# Patient Record
Sex: Female | Born: 1975 | Race: Black or African American | Hispanic: No | Marital: Single | Smoking: Never smoker
Health system: Southern US, Community
[De-identification: ages and names within clinical notes are randomized; demographics above are authoritative.]

## PROBLEM LIST (undated history)

## (undated) DIAGNOSIS — M549 Dorsalgia, unspecified: Secondary | ICD-10-CM

## (undated) DIAGNOSIS — G2581 Restless legs syndrome: Secondary | ICD-10-CM

## (undated) DIAGNOSIS — R42 Dizziness and giddiness: Secondary | ICD-10-CM

## (undated) DIAGNOSIS — F419 Anxiety disorder, unspecified: Secondary | ICD-10-CM

## (undated) HISTORY — DX: Dizziness and giddiness: R42

## (undated) HISTORY — DX: Restless legs syndrome: G25.81

## (undated) HISTORY — DX: Anxiety disorder, unspecified: F41.9

## (undated) HISTORY — PX: APPENDECTOMY: SHX54

## (undated) HISTORY — PX: CARPAL TUNNEL RELEASE: SHX101

---

## 2000-09-27 ENCOUNTER — Emergency Department (HOSPITAL_COMMUNITY): Admission: EM | Admit: 2000-09-27 | Discharge: 2000-09-27 | Payer: Self-pay

## 2001-05-04 ENCOUNTER — Emergency Department (HOSPITAL_COMMUNITY): Admission: EM | Admit: 2001-05-04 | Discharge: 2001-05-04 | Payer: Self-pay | Admitting: Emergency Medicine

## 2001-05-04 ENCOUNTER — Encounter: Payer: Self-pay | Admitting: Emergency Medicine

## 2002-09-07 ENCOUNTER — Emergency Department (HOSPITAL_COMMUNITY): Admission: EM | Admit: 2002-09-07 | Discharge: 2002-09-07 | Payer: Self-pay | Admitting: *Deleted

## 2003-11-24 ENCOUNTER — Emergency Department (HOSPITAL_COMMUNITY): Admission: EM | Admit: 2003-11-24 | Discharge: 2003-11-24 | Payer: Self-pay | Admitting: Emergency Medicine

## 2004-06-28 ENCOUNTER — Emergency Department (HOSPITAL_COMMUNITY): Admission: EM | Admit: 2004-06-28 | Discharge: 2004-06-28 | Payer: Self-pay | Admitting: Emergency Medicine

## 2006-07-17 ENCOUNTER — Emergency Department (HOSPITAL_COMMUNITY): Admission: EM | Admit: 2006-07-17 | Discharge: 2006-07-17 | Payer: Self-pay | Admitting: Emergency Medicine

## 2006-10-01 ENCOUNTER — Emergency Department (HOSPITAL_COMMUNITY): Admission: EM | Admit: 2006-10-01 | Discharge: 2006-10-01 | Payer: Self-pay | Admitting: Emergency Medicine

## 2007-11-28 ENCOUNTER — Emergency Department (HOSPITAL_COMMUNITY): Admission: EM | Admit: 2007-11-28 | Discharge: 2007-11-28 | Payer: Self-pay | Admitting: Emergency Medicine

## 2008-08-19 ENCOUNTER — Emergency Department (HOSPITAL_COMMUNITY): Admission: EM | Admit: 2008-08-19 | Discharge: 2008-08-19 | Payer: Self-pay | Admitting: *Deleted

## 2015-02-05 ENCOUNTER — Emergency Department (HOSPITAL_COMMUNITY)
Admission: EM | Admit: 2015-02-05 | Discharge: 2015-02-05 | Disposition: A | Discharge status: Home / Self Care | Payer: No Typology Code available for payment source | Attending: Emergency Medicine | Admitting: Emergency Medicine

## 2015-02-05 ENCOUNTER — Emergency Department (EMERGENCY_DEPARTMENT_HOSPITAL)
Admit: 2015-02-05 | Discharge: 2015-02-05 | Disposition: A | Discharge status: Home / Self Care | Payer: No Typology Code available for payment source | Attending: Emergency Medicine | Admitting: Emergency Medicine

## 2015-02-05 ENCOUNTER — Encounter (HOSPITAL_COMMUNITY): Payer: Self-pay | Admitting: Emergency Medicine

## 2015-02-05 DIAGNOSIS — Z3202 Encounter for pregnancy test, result negative: Secondary | ICD-10-CM | POA: Insufficient documentation

## 2015-02-05 DIAGNOSIS — M79609 Pain in unspecified limb: Secondary | ICD-10-CM

## 2015-02-05 DIAGNOSIS — M5416 Radiculopathy, lumbar region: Secondary | ICD-10-CM | POA: Diagnosis not present

## 2015-02-05 DIAGNOSIS — M545 Low back pain: Secondary | ICD-10-CM | POA: Diagnosis present

## 2015-02-05 HISTORY — DX: Dorsalgia, unspecified: M54.9

## 2015-02-05 LAB — I-STAT CHEM 8, ED
BUN: 14 mg/dL (ref 6–20)
Calcium, Ion: 1.16 mmol/L (ref 1.12–1.23)
Chloride: 102 mmol/L (ref 101–111)
Creatinine, Ser: 0.9 mg/dL (ref 0.44–1.00)
Glucose, Bld: 93 mg/dL (ref 65–99)
HCT: 42 % (ref 36.0–46.0)
Hemoglobin: 14.3 g/dL (ref 12.0–15.0)
Potassium: 4.4 mmol/L (ref 3.5–5.1)
Sodium: 138 mmol/L (ref 135–145)
TCO2: 24 mmol/L (ref 0–100)

## 2015-02-05 LAB — I-STAT BETA HCG BLOOD, ED (MC, WL, AP ONLY): I-stat hCG, quantitative: <5 m[IU]/mL (ref <5)

## 2015-02-05 MED ORDER — METHOCARBAMOL 500 MG PO TABS
1000.0000 mg | ORAL_TABLET | Freq: Once | ORAL | Status: AC
  Administered 2015-02-05: 1000 mg via ORAL
  Filled 2015-02-05: qty 2

## 2015-02-05 MED ORDER — METHOCARBAMOL 500 MG PO TABS: 1000.0000 mg | ORAL_TABLET | Freq: Four times a day (QID) | ORAL | Status: AC | PRN

## 2015-02-05 NOTE — Discharge Instructions (Signed)
For pain control you may take up to  of Motrin (also known as ibuprofen). That is usually 4 over the counter pills,  3 times a day. Take with food to minimize stomach irritation   You can also take  tylenol (acetaminophen)  (this is 3 over the counter pills) four times a day. Do not drink alcohol or combine with other medications that have acetaminophen as an ingredient (Read the labels!).    For breakthrough pain you may take Robaxin. Do not drink alcohol, drive or operate heavy machinery when taking Robaxin.  Do not hesitate to return to the emergency room for any new, worsening or concerning symptoms.  Please obtain primary care using resource guide below. Let them know that you were seen in the emergency room and that they will need to obtain records for further outpatient management.   Lumbosacral Radiculopathy Lumbosacral radiculopathy is a condition that involves the spinal nerves and nerve roots in the low back and bottom of the spine. The condition develops when these nerves and nerve roots move out of place or become inflamed and cause symptoms. CAUSES This condition may be caused by:  Pressure from a disk that bulges out of place (herniated disk). A disk is a plate of cartilage that separates bones in the spine.  Disk degeneration.  A narrowing of the bones of the lower back (spinal stenosis).  A tumor.  An infection.  An injury that places sudden pressure on the disks that cushion the bones of your lower spine. RISK FACTORS This condition is more likely to develop in:  Males aged 30-50 years.  Females aged 50-60 years.  People who lift improperly.  People who are overweight or live a sedentary lifestyle.  People who smoke.  People who perform repetitive activities that strain the spine. SYMPTOMS Symptoms of this condition include:  Pain that goes down from the back into the legs (sciatica). This is the most common symptom. The pain may be worse with  sitting, coughing, or sneezing.  Pain and numbness in the arms and legs.  Muscle weakness.  Tingling.  Loss of bladder control or bowel control. DIAGNOSIS This condition is diagnosed with a physical exam and medical history. If the pain is lasting, you may have tests, such as:  MRI scan.  X-ray.  CT scan.  Myelogram.  Nerve conduction study. TREATMENT This condition is often treated with:  Hot packs and ice applied to affected areas.  Stretches to improve flexibility.  Exercises to strengthen back muscles.  Physical therapy.  Pain medicine.  A steroid injection in the spine. In some cases, no treatment is needed. If the condition is long-lasting (chronic), or if symptoms are severe, treatment may involve surgery or lifestyle changes, such as following a weight loss plan. HOME CARE INSTRUCTIONS Medicines  Take medicines only as directed by your health care provider.  Do not drive or operate heavy machinery while taking pain medicine. Injury Care  Apply a heat pack to the injured area as directed by your health care provider.  Apply ice to the affected area:  Put ice in a plastic bag.  Place a towel between your skin and the bag.  Leave the ice on for 20-30 minutes, every 2 hours while you are awake or as needed. Or, leave the ice on for as long as directed by your health care provider. Other Instructions  If you were shown how to do any exercises or stretches, do them as directed by your health care  provider.  If your health care provider prescribed a diet or exercise program, follow it as directed.  Keep all follow-up visits as directed by your health care provider. This is important. SEEK MEDICAL CARE IF:  Your pain does not improve over time even when taking pain medicines. SEEK IMMEDIATE MEDICAL CARE IF:  Your develop severe pain.  Your pain suddenly gets worse.  You develop increasing weakness in your legs.  You lose the ability to control  your bladder or bowel.  You have difficulty walking or balancing.  You have a fever.   This information is not intended to replace advice given to you by your health care provider. Make sure you discuss any questions you have with your health care provider.   Document Released: 03/07/2005 Document Revised: 07/22/2014 Document Reviewed: 03/03/2014 Elsevier Interactive Patient Education 2016 [employer].   Emergency Department Resource Guide 1) Find a Doctor and Pay Out of Pocket Although you won't have to find out who is covered by your insurance plan, it is a good idea to ask around and get recommendations. You will then need to call the office and see if the doctor you have chosen will accept you as a new patient and what types of options they offer for patients who are self-pay. Some doctors offer discounts or will set up payment plans for their patients who do not have insurance, but you will need to ask so you aren't surprised when you get to your appointment.  2) Contact Your Local Health Department Not all health departments have doctors that can see patients for sick visits, but many do, so it is worth a call to see if yours does. If you don't know where your local health department is, you can check in your phone book. The CDC also has a tool to help you locate your state's health department, and many state websites also have listings of all of their local health departments.  3) Find a Walk-in Clinic If your illness is not likely to be very severe or complicated, you may want to try a walk in clinic. These are popping up all over the country in pharmacies, drugstores, and shopping centers. They're usually staffed by nurse practitioners or physician assistants that have been trained to treat common illnesses and complaints. They're usually fairly quick and inexpensive. However, if you have serious medical issues or chronic medical problems, these are probably not your best option.  No  Primary Care Doctor: - Call Health Connect at  [phone] - they can help you locate a primary care doctor that  accepts your insurance, provides certain services, etc. - Physician Referral Service- [phone]  Chronic Pain Problems: Organization         Address  Phone   Notes  [facility]  [phone] Patients need to be referred by their primary care doctor.   Medication Assistance: Organization         Address  Phone   Notes  [facility] Medication [facility] [address] [place] [place], [place] [address] [phone] --Must be a resident of [facility] -- Must have NO insurance coverage whatsoever (no Medicaid/ Medicare, etc.) -- The pt. MUST have a primary care doctor that directs their care regularly and follows them in the community   [facility]ist  [phone]   [employer]  [phone]    Agencies that provide inexpensive medical care: Organization         Address  Phone   Notes  [facility]  [phone]   [facility]    [phone]) [phone]   [facility] [address] [place], [place] [address] [phone]) [phone]   [facility] [address], [place] [phone]) [phone]   [facility]anned Parenthood    [phone](336) [phone]   [facility]    [phone](336) [phone]   [facility]  [address], [place] Phone:  [phone](336) [phone], Fax:  [phone] Hours of Operation:  9 am - 6 pm, M-F.  Also accepts Medicaid/Medicare and self-pay.  [facility]  [address], [place] Phone: [phone](336) [phone], Fax: [phone](336) [phone]. Hours of Operation:  8:30 am - 5:30 pm, M-F.  Also accepts Medicaid and self-pay.  [facility] [place] [address], [place] Phone: [phone]) [phone]   [facility] [address], [place], [place] [phone], Ext. [phone] Mondays & Thursdays: 7-9 AM.  First 15 patients are seen on  a first come, first serve basis.    Medicaid-accepting [facility] Providers:  Organization         Address  Phone   Notes  [facility] [address], [place] [phone]) [phone] Also accepts self-pay patients.  [facility] [address] [place], [place]  [phone]) [phone]   [facility] [address], [place] [phone]) [phone]   [facility] [address] [place] [address], [place] [phone](336) [phone]   Renaye RakersVeita Bland [address], [place]   [phone] Only accepts [place] Access [place] patients after they have their name applied to their card.   Self-Pay (no insurance) in [facility]:  Organization         Address  Phone   Notes  Sickle Cell Patients, [facility] [address], [place] [phone]) [phone]   [facility] [address], [place] [phone]) [phone]   [facility] [place]  [address], [place] [phone]) [phone]   [facility]/Dr. Osei-Bonsu  [address] [place] [address], [place] or [id number], [place] [phone]) [phone] Phone number for both [place] and [place] locations is the same.  [facility] [address], [place] [phone]) [phone]   [facility] [place] [address] [place] [address], [place] or [address] [phone]) [phone]) [phone]   [facility] [address], [place] [phone]) [phone], phone; [phone](336) [phone], fax Sees patients 1st and 3rd Saturday of every month.  Must not qualify for public or private insurance (i.e. Medicaid, Medicare, [place] Health Choice, Veterans' Benefits)  Household income should be no more than 200% of the poverty level The clinic cannot treat you if you are pregnant or think you are pregnant  Sexually transmitted diseases are not treated at the clinic.    Dental Care: Organization          Address  Phone  Notes  [facility] Department of [facility] [address], [place] [phone]) [phone] Accepts children up to age 37 who are enrolled in [place] or [place] Health Choice; pregnant women with a Medicaid card; and children who have applied for Medicaid or [place] Health Choice, but were declined, whose parents can pay a reduced fee at time of service.  [facility] Department of [facility]  [address], [place] [phone] Accepts children up to age 7 who are enrolled in [place] or [place] Health Choice; pregnant women with a Medicaid card; and children who have applied for Medicaid or [place] Health Choice, but were declined, whose parents can pay a reduced fee at time of service.  [facility]  [address] [place], [place] [phone] Patients are seen by appointment only. Walk-ins are not accepted. [facility] will see patients 50 years of age and older. Monday - Tuesday (8am-5pm) Most Wednesdays (8:30-5pm) $30 per visit, cash only  [facility] Adult [facility] Access PRO[facility]  [address], [facility] [place] [phone] Patients are seen by appointment only. Walk-ins are not accepted. [facility] will see patients 95 years of age and older. One Wednesday Evening (Monthly: Volunteer Based).  $30 per visit, cash only  [employer]  [phone] for adults; Children under age 93, call Graduate Pediatric Dentistry at [phone]. Children aged 82-14, please call [phone] to request a pediatric application.  Dental services are provided in all areas of dental care including fillings, crowns and bridges, complete and partial dentures, implants, gum treatment, root canals, and extractions. Preventive care is also provided. Treatment is provided to both adults and children. Patients are selected via a lottery and there is often a waiting list.   [facility] [address] [place]  [phone] [email or web address]   [facility] [address] [place], [place] [phone], Ext. [phone] Second and Fourth Thursday of each month, opens at 6:30 AM; Clinic ends at 9 AM.  Patients are seen on a first-come first-served basis, and a limited number are seen during each clinic.   [facility]  [address] [place], [place] [phone]   Eligibility Requirements You must have lived in [place], [place], or [place] counties for at least the last three months.   You cannot be eligible for state or federal sponsored [place], including [employer], [place], or [employer].   You generally cannot be eligible for healthcare insurance through your employer.    How to apply: Eligibility screenings are held every Tuesday and Wednesday afternoon from 1:00 pm until 4:00 pm. You do not need an appointment for the interview!  [facility] [address] [place], [place] [phone]   [facility] Health Department  [phone]   [facility] Health Department  [phone]   [facility] Health Department  [phone]    Behavioral Health Resources in the Community: Intensive Outpatient Programs Organization         Address  Phone  Notes  [facility] [address], [place], [place] [phone]   [facility] Outpatient [address], [place], [place] [phone]   [facility] [address], [place], [place]  [phone]   [facility] Mental Health [address]  [place], [place] [phone] or [phone]   Substance Abuse Resources Organization         Address  Phone  Notes  [facility] Drug Services  [phone]   [facility]  [phone]   [facility] [place]  [phone]   Floydene Flock  [phone]   Residential & Outpatient Substance Abuse Program  [phone]   Psychological  Services Organization         Address  Phone  Notes  [facility]  [phone]   [facility] Services  [phone]   [facility] Mental Health [address], [place] [phone] or [phone]    Mobile Crisis Teams Organization         Address  Phone  Notes  [facility], Mobile Crisis Care Unit  [phone]   [facility]  [address] [place], [place] [phone]   Doristine Locks [address] [place] [phone]    Self-Help/Support Groups Organization         Address  Phone             Notes  [facility]oc[facility] of [place] - variety of support groups  [phone]- [phone] Call for more information  Narcotics Anonymous (NA), [facility] [address], [employer] [place]  2 meetings at this location   Statistician         Address  Phone  Notes  [facility] [address],    [place]  [phone]   [facility]  [address] [place] [address], [place], [place] [phone]   [facility] [address] [place], [place] [phone] Admissions: 8am-3pm M-F  [facility] [address]    [place], [place] [phone]   [facility] [address] [place], [place], [place] [phone]   [facility] [address],  [place], [place] [phone]   [facility] - Intensive Outpatient [address], [place], [place] [phone]   [facility] ([facility] [address] [place]  [place], [place] [phone] or [phone]   [facility] ([facility]) [address] [place], [place] [phone] Accepts Medicaid  [facility] [address]  [place] [phone] Substance Abuse/Addiction Treatment   [facility] Organization         Address  Phone  Notes  [facility]  [phone]   Angie Fava, PhD [address] [place], [place]   [phone] or [phone]   [facility] Behavioral   [address] [place], [place] [phone]   [facility] [address], [place], [place] [phone] Insurance/Medicaid/sponsorship through [facility] and Families [address]                                    [place], [place] [phone] Therapy/tele-psych/case  [facility] [address], [place] [phone]    Dr. Lolly Mustache  [phone]   [facility]  [employer] [facility] Dept. 1) [address] [place] 2) [address] [place] 3)  [address], [place] [phone]  [phone]   [facility] Child Abuse Hotline 580-426-5219 or [phone] (After Hours)

## 2015-02-05 NOTE — ED Notes (Signed)
Per pt, states chronic back pain-started having right leg pain and tingling for a couple of days-states OTC meds not helping

## 2015-02-05 NOTE — ED Notes (Signed)
Pt ambulatory to wheelchair. Given orthopedic follow up and resource guide for PCP follow up. No other c/c.

## 2015-02-05 NOTE — Progress Notes (Signed)
VASCULAR LAB PRELIMINARY  PRELIMINARY  PRELIMINARY  PRELIMINARY  Right lower extremity venous duplex completed.    Preliminary report:  Right:  No evidence of DVT, superficial thrombosis, or Baker's cyst.  Arris Meyn, RVS 02/05/2015, 11:51 AM

## 2015-02-05 NOTE — ED Provider Notes (Signed)
CSN: [id number]     Arrival date & time 02/05/15  0919 History   First MD Initiated Contact with Patient 02/05/15 1006     Chief Complaint  Patient presents with  . back pain/leg pain      (Consider location/radiation/quality/duration/timing/severity/associated sxs/prior Treatment) HPI   Blood pressure 139/85, pulse 80, temperature 98.2 F (36.8 C), temperature source Oral, resp. rate 16, last menstrual period 01/22/2015, SpO2 99 %.  Tonya Campbell is a 39 y.o. female complaining of exacerbation of her chronic low back pain, this is atraumatic. States that the pain is in the low back and normally radiates down the posterior leg to the knee. States that over the course of the last week it has gone into the calf, calf is very tender to the touch. She's been taking Motrin 800 mg 3 times a day which normally alleviates the pain but has not been over the last several days. Pt denies fever, cough, h/o DVT/PE, calf pain or leg swelling, hemoptysis, recent immobilization, cancer/chemotherapy in the last 6 months, exogenous estrogen fever, chills, change in bowel or bladder habits, h/o IDVU or cancer, numbness or weakness.    Past Medical History  Diagnosis Date  . Back pain    Past Surgical History  Procedure Laterality Date  . Appendectomy     No family history on file. Social History  Substance Use Topics  . Smoking status: Never Smoker   . Smokeless tobacco: None  . Alcohol Use: No   OB History    No data available     Review of Systems  10 systems reviewed and found to be negative, except as noted in the HPI.   Allergies  Review of patient's allergies indicates not on file.  Home Medications   Prior to Admission medications   Not on File   BP 139/85 mmHg  Pulse 80  Temp(Src) 98.2 F (36.8 C) (Oral)  Resp 16  SpO2 99%  LMP 01/22/2015 Physical Exam  Constitutional: She appears well-developed and well-nourished.  HENT:  Head: Normocephalic.  Eyes: Conjunctivae  are normal.  Neck: Normal range of motion.  Cardiovascular: Normal rate, regular rhythm and intact distal pulses.   Pulmonary/Chest: Effort normal.  Abdominal: Soft. There is no tenderness.  Musculoskeletal: She exhibits tenderness. She exhibits no edema.  Patient is tender on the right calf there is no swelling, superficial collaterals are palpable cords.  Neurological: She is alert.  No point tenderness to percussion of lumbar spinal processes.  No TTP or paraspinal muscular spasm. Strength is 5 out of 5 to bilateral lower extremities at hip and knee; extensor hallucis longus 5 out of 5. Ankle strength 5 out of 5, no clonus, neurovascularly intact. No saddle anaesthesia. Patellar reflexes are 2+ bilaterally.    Right leg raise is negative bilaterally. Patient ambulates with a coordinated in nonantalgic gait.   Psychiatric: She has a normal mood and affect.  Nursing note and vitals reviewed.   ED Course  Procedures (including critical care time) Labs Review Labs Reviewed - No data to display  Imaging Review No results found. I have personally reviewed and evaluated these images and lab results as part of my medical decision-making.   EKG Interpretation None      MDM   Final diagnoses:  Lumbar radiculopathy, acute    Filed Vitals:   02/05/15 0935  BP: 139/85  Pulse: 80  Temp: 98.2 F (36.8 C)  TempSrc: Oral  Resp: 16  SpO2: 99%    Medications  methocarbamol (ROBAXIN) tablet 1,000 mg (1,000 mg Oral Given 02/05/15 1122)    Tonya Campbell is 39 y.o. female presenting with worsening of her chronic low back pain, it radiates down to the calf. Physical exam is not consistent with DVT/PE and patient has no risk factors but she is very tender on the calf, this is a concern to her. Venous ultrasound is ordered and will check chemistries.  Patient is not pregnant, she has no electrolyte abnormalities, DVT study negative  Evaluation does not show pathology that would  require ongoing emergent intervention or inpatient treatment. Pt is hemodynamically stable and mentating appropriately. Discussed findings and plan with patient/guardian, who agrees with care plan. All questions answered. Return precautions discussed and outpatient follow up given.   New Prescriptions   METHOCARBAMOL (ROBAXIN) 500 MG TABLET    Take 2 tablets (1,000 mg total) by mouth 4 (four) times daily as needed (Pain).         Wynetta Emeryicole Tilden Broz, PA-C 02/05/15 1230  Marily MemosJason Mesner, MD 02/05/15 [phone]

## 2015-08-26 ENCOUNTER — Emergency Department (HOSPITAL_COMMUNITY)
Admission: EM | Admit: 2015-08-26 | Discharge: 2015-08-26 | Disposition: A | Discharge status: Home / Self Care | Payer: No Typology Code available for payment source | Attending: Emergency Medicine | Admitting: Emergency Medicine

## 2015-08-26 ENCOUNTER — Encounter (HOSPITAL_COMMUNITY): Payer: Self-pay | Admitting: Emergency Medicine

## 2015-08-26 DIAGNOSIS — Z79899 Other long term (current) drug therapy: Secondary | ICD-10-CM | POA: Insufficient documentation

## 2015-08-26 DIAGNOSIS — Z52000 Unspecified donor, whole blood: Secondary | ICD-10-CM | POA: Insufficient documentation

## 2015-08-26 DIAGNOSIS — Z52008 Unspecified donor, other blood: Secondary | ICD-10-CM

## 2015-08-26 DIAGNOSIS — R55 Syncope and collapse: Secondary | ICD-10-CM | POA: Insufficient documentation

## 2015-08-26 MED ORDER — SODIUM CHLORIDE 0.9 % IV BOLUS (SEPSIS): 1000.0000 mL | Freq: Once | INTRAVENOUS | Status: DC

## 2015-08-26 MED ORDER — SODIUM CHLORIDE 0.9 % IV BOLUS (SEPSIS)
1000.0000 mL | Freq: Once | INTRAVENOUS | Status: AC
  Administered 2015-08-26: 1000 mL via INTRAVENOUS

## 2015-08-26 NOTE — Discharge Instructions (Signed)

## 2015-08-26 NOTE — ED Notes (Signed)
Bed: WA04 Expected date:  Expected time:  Means of arrival:  Comments: EMS syncopal

## 2015-08-26 NOTE — ED Notes (Signed)
Patient is a&ox4, ambulatory, denies complaints at this time. Questions concerns denied r/t dc

## 2015-08-26 NOTE — ED Provider Notes (Signed)
CSN: [id number]     Arrival date & time 08/26/15  1259 History   First MD Initiated Contact with Patient 08/26/15 1313     Chief Complaint  Patient presents with  . Loss of Consciousness     (Consider location/radiation/quality/duration/timing/severity/associated sxs/prior Treatment) Patient is a 40 y.o. female presenting with syncope. The history is provided by the patient.  Loss of Consciousness Episode history:  Single Most recent episode:  Today Duration:  30 seconds Timing:  Constant Progression:  Unchanged Chronicity:  New Context: blood draw (plasma donation)   Witnessed: yes   Relieved by:  Nothing Worsened by:  Nothing tried Ineffective treatments:  None tried Associated symptoms: dizziness and vomiting   Associated symptoms: no fever and no shortness of breath     Past Medical History  Diagnosis Date  . Back pain    Past Surgical History  Procedure Laterality Date  . Appendectomy     No family history on file. Social History  Substance Use Topics  . Smoking status: Never Smoker   . Smokeless tobacco: None  . Alcohol Use: No   OB History    No data available     Review of Systems  Constitutional: Negative for fever.  Respiratory: Negative for shortness of breath.   Cardiovascular: Positive for syncope.  Gastrointestinal: Positive for vomiting.  Neurological: Positive for dizziness.  All other systems reviewed and are negative.     Allergies  Review of patient's allergies indicates no known allergies.  Home Medications   Prior to Admission medications   Medication Sig Start Date End Date Taking? Authorizing Provider  ibuprofen (ADVIL,MOTRIN) 200 MG tablet Take 800 mg by mouth every 8 (eight) hours as needed (For pain.).    Historical Provider, MD  methocarbamol (ROBAXIN) 500 MG tablet Take 2 tablets (1,000 mg total) by mouth 4 (four) times daily as needed (Pain). 02/05/15   Nicole Pisciotta, PA-C  Multiple Vitamin (MULTIVITAMIN WITH MINERALS)  TABS tablet Take 1 tablet by mouth daily.    Historical Provider, MD  OVER THE COUNTER MEDICATION Place 1 patch onto the skin daily as needed (For back pain.). Well Patch Backache Pain Relief Patch    Historical Provider, MD   BP 112/80 mmHg  Pulse 64  Temp(Src) 97.4 F (36.3 C) (Oral)  Resp 18  Ht 5\' 1"  (1.549 m)  Wt 164 lb (74.39 kg)  BMI 31.00 kg/m2  SpO2 100%  LMP 08/05/2015 Physical Exam  Constitutional: She is oriented to person, place, and time. She appears well-developed and well-nourished. No distress.  HENT:  Head: Normocephalic.  Eyes: Conjunctivae are normal.  Neck: Neck supple. No tracheal deviation present.  Cardiovascular: Normal rate, regular rhythm and normal heart sounds.   Pulmonary/Chest: Effort normal and breath sounds normal. No respiratory distress. She has no wheezes. She has no rales.  Abdominal: Soft. She exhibits no distension. There is no tenderness. There is no rebound.  Neurological: She is alert and oriented to person, place, and time.  Skin: Skin is warm and dry.  Psychiatric: She has a normal mood and affect.  Vitals reviewed.   ED Course  Procedures (including critical care time) Labs Review Labs Reviewed - No data to display  Imaging Review No results found. I have personally reviewed and evaluated these images and lab results as part of my medical decision-making.   EKG Interpretation None      MDM   Final diagnoses:  Blood donor, plasma  Syncope and collapse    39  y.o. female presents with loss of consciousness episode that occurred just prior to arrival after she donated around 800 mL of plasma and the machine for donation was unable to provide her with a saline bolus. She became lightheaded and appeared to have a vasovagal syncope likely secondary to transient hypovolemia. Upon waking she felt nauseated, vomited and had some loose stools. She was given a 500 mL bolus of saline in route with EMS and her blood pressure recovered  appropriately, she is currently asymptomatic.  I offered her another liter of fluid to try to help alleviate her symptoms, she states that she has recovered completely and wishes to be discharged. Patient agreed to stay for a liter of fluids and then will be discharged with return precautions for worsening or new concerning symptoms.  Lyndal Pulley, MD 08/26/15 1345

## 2015-08-26 NOTE — ED Notes (Addendum)
Per EMS, patient was giving blood and passe out.  She donated 700 ml. Patient was down for about 60 seconds.  EMS was not able to BP manually initially.  Once she came thru, she started vomiting and diarrhea. She was cold and diaphoretic.    Patient states she did not hit her head  EMS administered 500 ml of fluids which brought her BP from 80/50 to 106/60  HR: 60 100% on room air R:20

## 2017-05-30 DIAGNOSIS — Z0000 Encounter for general adult medical examination without abnormal findings: Secondary | ICD-10-CM | POA: Diagnosis not present

## 2017-05-30 DIAGNOSIS — Z13220 Encounter for screening for lipoid disorders: Secondary | ICD-10-CM | POA: Diagnosis not present

## 2017-06-08 DIAGNOSIS — R200 Anesthesia of skin: Secondary | ICD-10-CM | POA: Diagnosis not present

## 2017-07-12 DIAGNOSIS — G5601 Carpal tunnel syndrome, right upper limb: Secondary | ICD-10-CM | POA: Diagnosis not present

## 2017-08-02 DIAGNOSIS — G5602 Carpal tunnel syndrome, left upper limb: Secondary | ICD-10-CM | POA: Diagnosis not present

## 2017-08-02 DIAGNOSIS — G5601 Carpal tunnel syndrome, right upper limb: Secondary | ICD-10-CM | POA: Diagnosis not present

## 2017-09-05 DIAGNOSIS — G5601 Carpal tunnel syndrome, right upper limb: Secondary | ICD-10-CM | POA: Diagnosis not present

## 2017-12-06 DIAGNOSIS — M65311 Trigger thumb, right thumb: Secondary | ICD-10-CM | POA: Diagnosis not present

## 2017-12-06 DIAGNOSIS — G5601 Carpal tunnel syndrome, right upper limb: Secondary | ICD-10-CM | POA: Diagnosis not present

## 2017-12-06 DIAGNOSIS — M65312 Trigger thumb, left thumb: Secondary | ICD-10-CM | POA: Diagnosis not present

## 2017-12-14 DIAGNOSIS — M9903 Segmental and somatic dysfunction of lumbar region: Secondary | ICD-10-CM | POA: Diagnosis not present

## 2017-12-14 DIAGNOSIS — M9905 Segmental and somatic dysfunction of pelvic region: Secondary | ICD-10-CM | POA: Diagnosis not present

## 2017-12-14 DIAGNOSIS — M5408 Panniculitis affecting regions of neck and back, sacral and sacrococcygeal region: Secondary | ICD-10-CM | POA: Diagnosis not present

## 2017-12-21 DIAGNOSIS — Z23 Encounter for immunization: Secondary | ICD-10-CM | POA: Diagnosis not present

## 2017-12-21 DIAGNOSIS — M545 Low back pain: Secondary | ICD-10-CM | POA: Diagnosis not present

## 2018-01-03 DIAGNOSIS — M9903 Segmental and somatic dysfunction of lumbar region: Secondary | ICD-10-CM | POA: Diagnosis not present

## 2018-01-03 DIAGNOSIS — M5408 Panniculitis affecting regions of neck and back, sacral and sacrococcygeal region: Secondary | ICD-10-CM | POA: Diagnosis not present

## 2018-01-03 DIAGNOSIS — M9905 Segmental and somatic dysfunction of pelvic region: Secondary | ICD-10-CM | POA: Diagnosis not present

## 2018-01-29 DIAGNOSIS — M2560 Stiffness of unspecified joint, not elsewhere classified: Secondary | ICD-10-CM | POA: Diagnosis not present

## 2018-01-29 DIAGNOSIS — M9903 Segmental and somatic dysfunction of lumbar region: Secondary | ICD-10-CM | POA: Diagnosis not present

## 2018-01-29 DIAGNOSIS — M9901 Segmental and somatic dysfunction of cervical region: Secondary | ICD-10-CM | POA: Diagnosis not present

## 2018-03-08 DIAGNOSIS — M5416 Radiculopathy, lumbar region: Secondary | ICD-10-CM | POA: Diagnosis not present

## 2018-03-08 DIAGNOSIS — M9901 Segmental and somatic dysfunction of cervical region: Secondary | ICD-10-CM | POA: Diagnosis not present

## 2018-03-08 DIAGNOSIS — M9903 Segmental and somatic dysfunction of lumbar region: Secondary | ICD-10-CM | POA: Diagnosis not present

## 2018-03-19 DIAGNOSIS — M65312 Trigger thumb, left thumb: Secondary | ICD-10-CM | POA: Diagnosis not present

## 2018-03-19 DIAGNOSIS — M1811 Unilateral primary osteoarthritis of first carpometacarpal joint, right hand: Secondary | ICD-10-CM | POA: Diagnosis not present

## 2018-03-23 DIAGNOSIS — R5383 Other fatigue: Secondary | ICD-10-CM | POA: Diagnosis not present

## 2018-03-23 DIAGNOSIS — M79606 Pain in leg, unspecified: Secondary | ICD-10-CM | POA: Diagnosis not present

## 2018-03-23 DIAGNOSIS — M545 Low back pain: Secondary | ICD-10-CM | POA: Diagnosis not present

## 2018-03-23 DIAGNOSIS — M791 Myalgia, unspecified site: Secondary | ICD-10-CM | POA: Diagnosis not present

## 2018-03-23 DIAGNOSIS — M5416 Radiculopathy, lumbar region: Secondary | ICD-10-CM | POA: Diagnosis not present

## 2018-06-27 ENCOUNTER — Other Ambulatory Visit (HOSPITAL_COMMUNITY)
Admission: RE | Admit: 2018-06-27 | Discharge: 2018-06-27 | Disposition: A | Discharge status: Home / Self Care | Payer: PRIVATE HEALTH INSURANCE | Source: Ambulatory Visit | Attending: Family Medicine | Admitting: Family Medicine

## 2018-06-27 ENCOUNTER — Other Ambulatory Visit: Payer: Self-pay | Admitting: Family Medicine

## 2018-06-27 DIAGNOSIS — Z0000 Encounter for general adult medical examination without abnormal findings: Secondary | ICD-10-CM | POA: Insufficient documentation

## 2018-07-03 LAB — CYTOLOGY - PAP
Adequacy: ABSENT — AB
HPV: DETECTED — AB

## 2018-07-06 ENCOUNTER — Other Ambulatory Visit: Payer: Self-pay | Admitting: Family Medicine

## 2018-07-06 ENCOUNTER — Telehealth: Payer: Self-pay | Admitting: Radiology

## 2018-07-06 DIAGNOSIS — R002 Palpitations: Secondary | ICD-10-CM

## 2018-07-06 NOTE — Telephone Encounter (Signed)
Enrolled patient for a 3 day Zio long Term monitor to be mailed due to Covid-19. Instructions were gone over with patient and she knows to expect monitor in 3-4 days *note-24hr holter was switched to 3 day Zio so monitor could be mailed

## 2018-07-10 ENCOUNTER — Ambulatory Visit (INDEPENDENT_AMBULATORY_CARE_PROVIDER_SITE_OTHER): Payer: No Typology Code available for payment source

## 2018-07-10 DIAGNOSIS — R002 Palpitations: Secondary | ICD-10-CM | POA: Diagnosis not present

## 2018-07-13 ENCOUNTER — Other Ambulatory Visit: Payer: Self-pay | Admitting: Obstetrics and Gynecology

## 2018-07-23 ENCOUNTER — Other Ambulatory Visit: Payer: Self-pay

## 2018-07-23 ENCOUNTER — Telehealth: Payer: Self-pay | Admitting: *Deleted

## 2018-07-23 ENCOUNTER — Other Ambulatory Visit: Payer: Self-pay | Admitting: Family Medicine

## 2018-07-23 DIAGNOSIS — Z1231 Encounter for screening mammogram for malignant neoplasm of breast: Secondary | ICD-10-CM

## 2018-07-23 NOTE — Telephone Encounter (Signed)
New Message   Patient calling for Holter Monitor results can someone please call her back.

## 2018-07-30 ENCOUNTER — Ambulatory Visit: Payer: No Typology Code available for payment source

## 2018-07-31 ENCOUNTER — Telehealth: Payer: Self-pay

## 2018-07-31 NOTE — Telephone Encounter (Signed)
Patient returned your call.

## 2018-07-31 NOTE — Telephone Encounter (Signed)
Attempted to contact patient but there was no answer. Left message for patient to call back.   This patient has never been seen in our office. The patient's monitor was ordered by Horton Marshall, PA. Was going to give preliminary results and ask that the patient reach out to Horton Marshall, PA for review and recommendation.

## 2018-07-31 NOTE — Telephone Encounter (Signed)
Preliminary results given to the patient. Instructed patient to reach out to Horton Marshall, PA the ordering provider, to have her review the results as this is not an established patient with cardiology. Patient verbalized understanding and thanked me for the call.

## 2018-07-31 NOTE — Telephone Encounter (Signed)
New Message    Pt is calling requesting her monitor results   Please call

## 2018-09-19 ENCOUNTER — Ambulatory Visit: Payer: No Typology Code available for payment source

## 2018-10-18 ENCOUNTER — Ambulatory Visit: Payer: PRIVATE HEALTH INSURANCE | Admitting: Neurology

## 2018-10-29 ENCOUNTER — Ambulatory Visit: Payer: PRIVATE HEALTH INSURANCE

## 2018-10-30 ENCOUNTER — Ambulatory Visit: Payer: PRIVATE HEALTH INSURANCE | Admitting: Neurology

## 2018-12-12 ENCOUNTER — Other Ambulatory Visit: Payer: Self-pay

## 2018-12-12 ENCOUNTER — Ambulatory Visit (INDEPENDENT_AMBULATORY_CARE_PROVIDER_SITE_OTHER): Payer: PRIVATE HEALTH INSURANCE | Admitting: Neurology

## 2018-12-12 ENCOUNTER — Encounter: Payer: Self-pay | Admitting: Neurology

## 2018-12-12 VITALS — BP 117/83 | HR 75 | Temp 97.5°F | Ht 61.0 in | Wt 173.0 lb

## 2018-12-12 DIAGNOSIS — G8929 Other chronic pain: Secondary | ICD-10-CM

## 2018-12-12 DIAGNOSIS — M5441 Lumbago with sciatica, right side: Secondary | ICD-10-CM

## 2018-12-12 NOTE — Progress Notes (Signed)
Reason for visit: Leg pain, fatigue  Referring physician: Dr. Hassel Neth is a 43 y.o. female  History of present illness:  Tonya Campbell is a 43 year old right-handed black female with a history of significant anxiety issues.  The patient may miss work occasionally because of anxiety episodes.  She has previously had some problems with feeling as if she had "brain fog", but this has since cleared.  The patient is having problems at nighttime with jerking and twitching of the legs, she also has muscle cramps at night and during the daytime.  She has been placed on Mirapex without much benefit, she takes Robaxin for the muscle cramps which does help.  The patient reports that since a fall on ice 12 years ago, she has had back pain and some discomfort down the right leg that has gradually worsened over time.  She has had increasing problems with burning and discomfort in the thighs bilaterally, when she walks she will have fatigue of the legs.  She has not had any true weakness or falls, she denies any balance issues.  She has no problems with using the arms.  She denies issues controlling the bowels or the bladder.  She has undergone physical therapy and chiropractic treatments without much benefit.  She is sent to this office for an evaluation.  Past Medical History:  Diagnosis Date  . Anxiety   . Back pain   . Dizziness   . RLS (restless legs syndrome)     Past Surgical History:  Procedure Laterality Date  . APPENDECTOMY    . CARPAL TUNNEL RELEASE Right     Family History  Problem Relation Age of Onset  . Diabetes Mother   . High blood pressure Mother   . Pancreatic cancer Father     Social history:  reports that she has never smoked. She has never used smokeless tobacco. She reports that she does not drink alcohol or use drugs.  Medications:  Prior to Admission medications   Medication Sig Start Date End Date Taking? Authorizing Provider  ALPRAZolam Prudy Feeler) 0.25  MG tablet  11/08/18  Yes [provider]  meloxicam (MOBIC) 15 MG tablet  12/06/18  Yes [provider]  methocarbamol (ROBAXIN) 500 MG tablet Take 2 tablets (1,000 mg total) by mouth 4 (four) times daily as needed (Pain). 02/05/15  Yes Pisciotta, Joni Reining, PA-C  Multiple Vitamin (MULTIVITAMIN WITH MINERALS) TABS tablet Take 1 tablet by mouth daily.   Yes [provider]  OVER THE COUNTER MEDICATION Place 1 patch onto the skin daily as needed (For back pain.). Well Patch Backache Pain Relief Patch   Yes [provider]  rOPINIRole (REQUIP) 0.25 MG tablet  11/02/18  Yes [provider]  sertraline (ZOLOFT) 100 MG tablet  11/08/18  Yes [provider]  VITAMIN D PO Take 2,000 Units by mouth.   Yes [provider]     No Known Allergies  ROS:  Out of a complete 14 system review of symptoms, the patient complains only of the following symptoms, and all other reviewed systems are negative.  Fatigue Palpitations of the heart Dizziness Joint pain, muscle cramps, achy muscles Confusion, headache, numbness, dizziness Anxiety, too much sleep, decreased energy Restless legs  Blood pressure 117/83, pulse 75, temperature (!) 97.5 F (36.4 C), temperature source Temporal, height 5\' 1"  (1.549 m), weight 173 lb (78.5 kg).  Physical Exam  General: The patient is alert and cooperative at the time of the examination.  Eyes: Pupils are equal, round, and reactive to light. Discs are flat bilaterally.  Neck: The neck is supple, no carotid bruits are noted.  Respiratory: The respiratory examination is clear.  Cardiovascular: The cardiovascular examination reveals a regular rate and rhythm, no obvious murmurs or rubs are noted.  Neuromuscular: The patient has excellent range of movement of the lumbar spine.  Skin: Extremities are without significant edema.  Neurologic Exam  Mental status: The patient is alert and oriented x 3 at the time  of the examination. The patient has apparent normal recent and remote memory, with an apparently normal attention span and concentration ability.  Cranial nerves: Facial symmetry is present. There is good sensation of the face to pinprick and soft touch bilaterally. The strength of the facial muscles and the muscles to head turning and shoulder shrug are normal bilaterally. Speech is well enunciated, no aphasia or dysarthria is noted. Extraocular movements are full. Visual fields are full. The tongue is midline, and the patient has symmetric elevation of the soft palate. No obvious hearing deficits are noted.  Motor: The motor testing reveals 5 over 5 strength of all 4 extremities. Good symmetric motor tone is noted throughout.  Sensory: Sensory testing is intact to pinprick, soft touch, vibration sensation, and position sense on all 4 extremities. No evidence of extinction is noted.  Coordination: Cerebellar testing reveals good finger-nose-finger and heel-to-shin bilaterally.  Gait and station: Gait is normal. Tandem gait is normal. Romberg is negative. No drift is seen.  The patient is able to walk on the heels and the toes bilaterally.  Reflexes: Deep tendon reflexes are symmetric, but are depressed bilaterally. Toes are downgoing bilaterally.   Assessment/Plan:  1.  History of back pain, right leg pain  2.  Burning discomfort in the thighs bilaterally, fatigue of the legs  3.  Leg cramps  4.  Possible restless leg syndrome  5.  Anxiety disorder  The patient clinically has a normal examination.  The patient reports gradually worsening back and right leg pain since a fall 12 years ago.  The patient will undergo an x-ray of the low back.  She will have nerve conductions on both legs and EMG on the right leg.  Pending final results of above we may consider further evaluation with blood work.  The patient will otherwise follow-up for the EMG evaluation.  Tonya Alexanders MD 12/12/2018  2:07 PM  [facility] [address] [place], [place] [address]  Phone [phone] Fax [phone]

## 2018-12-20 ENCOUNTER — Ambulatory Visit: Payer: PRIVATE HEALTH INSURANCE

## 2019-01-14 ENCOUNTER — Ambulatory Visit
Admission: RE | Admit: 2019-01-14 | Discharge: 2019-01-14 | Disposition: A | Discharge status: Home / Self Care | Payer: PRIVATE HEALTH INSURANCE | Source: Ambulatory Visit | Attending: Neurology | Admitting: Neurology

## 2019-01-14 DIAGNOSIS — M5441 Lumbago with sciatica, right side: Secondary | ICD-10-CM

## 2019-01-14 DIAGNOSIS — G8929 Other chronic pain: Secondary | ICD-10-CM

## 2019-01-15 ENCOUNTER — Telehealth: Payer: Self-pay | Admitting: Neurology

## 2019-01-15 NOTE — Telephone Encounter (Signed)
I called the patient.  The x-ray of the low back does not show any prior fractures, some degenerative changes at the S1-S2 level.  The patient is to come on 9 November for EMG study, I will see her then.   XR lumbar 01/14/19:  FINDINGS: Six lumbar type vertebra. The lowermost vertebra is numbered S1. There is no acute fracture or subluxation of the lumbar spine. There is degenerative changes with disc space narrowing at lumbarized S1-S2. The visualized posterior elements appear intact. Right lower quadrant surgical clips noted. The soft tissues are unremarkable.  IMPRESSION: No acute fracture or subluxation of the lumbar spine.

## 2019-01-28 ENCOUNTER — Telehealth: Payer: Self-pay | Admitting: Neurology

## 2019-01-28 ENCOUNTER — Encounter: Payer: PRIVATE HEALTH INSURANCE | Admitting: Neurology

## 2019-01-28 NOTE — Telephone Encounter (Signed)
I called the patient regarding rescheduling her 11/9 NCV/EMG (today) due to tech being out. I stayed on the phone with her while searching for next available NCV/EMG spot. I was unable to come across any available openings before the end of the year, so I advised patient that I would call her back once we have an opening. Patient expressed understanding & appreciation.

## 2019-01-28 NOTE — Telephone Encounter (Signed)
Pt called back wanting to know if a decision was made on when the NCV/EMG's would be r/s. Please advise.

## 2019-01-28 NOTE — Telephone Encounter (Signed)
Noted  

## 2019-01-29 NOTE — Telephone Encounter (Signed)
The front staff will be RS them today.

## 2019-01-31 ENCOUNTER — Other Ambulatory Visit: Payer: Self-pay

## 2019-01-31 ENCOUNTER — Ambulatory Visit
Admission: RE | Admit: 2019-01-31 | Discharge: 2019-01-31 | Disposition: A | Discharge status: Home / Self Care | Payer: PRIVATE HEALTH INSURANCE | Source: Ambulatory Visit | Attending: Family Medicine | Admitting: Family Medicine

## 2019-01-31 DIAGNOSIS — Z1231 Encounter for screening mammogram for malignant neoplasm of breast: Secondary | ICD-10-CM

## 2019-03-11 ENCOUNTER — Encounter: Payer: Self-pay | Admitting: Neurology

## 2019-03-11 ENCOUNTER — Encounter

## 2019-03-11 ENCOUNTER — Ambulatory Visit (INDEPENDENT_AMBULATORY_CARE_PROVIDER_SITE_OTHER): Payer: PRIVATE HEALTH INSURANCE | Admitting: Neurology

## 2019-03-11 ENCOUNTER — Other Ambulatory Visit: Payer: Self-pay

## 2019-03-11 ENCOUNTER — Ambulatory Visit: Payer: PRIVATE HEALTH INSURANCE | Admitting: Neurology

## 2019-03-11 DIAGNOSIS — M5431 Sciatica, right side: Secondary | ICD-10-CM

## 2019-03-11 DIAGNOSIS — M5441 Lumbago with sciatica, right side: Secondary | ICD-10-CM | POA: Diagnosis not present

## 2019-03-11 DIAGNOSIS — M5430 Sciatica, unspecified side: Secondary | ICD-10-CM | POA: Insufficient documentation

## 2019-03-11 DIAGNOSIS — G8929 Other chronic pain: Secondary | ICD-10-CM

## 2019-03-11 NOTE — Procedures (Signed)
     HISTORY:  Tonya Campbell is a 43 year old patient with a history of low back pain and right-sided leg pain going down to the foot.  The pain has gradually worsened over time, has been present for greater than 12 years.  The patient returns for EMG evaluation to exclude the possibility of a lumbar radiculopathy.  NERVE CONDUCTION STUDIES:  Nerve conduction studies were performed on both lower extremities. The distal motor latencies and motor amplitudes for the peroneal and posterior tibial nerves were within normal limits. The nerve conduction velocities for these nerves were also normal. The sensory latencies for the peroneal and sural nerves were within normal limits. The F wave latencies for the posterior tibial nerves were within normal limits.   EMG STUDIES:  EMG study was performed on the right lower extremity:  The tibialis anterior muscle reveals 2 to 4K motor units with full recruitment. No fibrillations or positive waves were seen. The peroneus tertius muscle reveals 2 to 4K motor units with full recruitment. No fibrillations or positive waves were seen. The medial gastrocnemius muscle reveals 1 to 3K motor units with full recruitment. No fibrillations or positive waves were seen. The vastus lateralis muscle reveals 2 to 4K motor units with full recruitment. No fibrillations or positive waves were seen. The iliopsoas muscle reveals 2 to 4K motor units with full recruitment. No fibrillations or positive waves were seen. The biceps femoris muscle (long head) reveals 2 to 4K motor units with full recruitment. No fibrillations or positive waves were seen. The lumbosacral paraspinal muscles were tested at 3 levels, and revealed no abnormalities of insertional activity at all 3 levels tested. There was good relaxation.   IMPRESSION:  Nerve conduction studies done on both lower extremities were unremarkable.  No evidence of a neuropathy is seen.  EMG evaluation of the right lower  extremity was unremarkable, no evidence of an overlying lumbar radiculopathy was seen.  Jill Alexanders MD 03/11/2019 4:13 PM  [facility] [address] [place], [place] [address]  Phone [phone] Fax [phone]

## 2019-03-11 NOTE — Progress Notes (Addendum)
Patient comes in for EMG and nerve conduction study, the nerve conductions and EMG of the right leg were completely normal.  The patient will be set up for MRI of the lumbar spine.  She has given notice of leave for work, she is unable to continue working due to the back pain and right-sided leg pain.  She will come out of work on 16 March 2019.  The patient is currently getting physical therapy.      [place]    Nerve / Sites Muscle Latency Ref. Amplitude Ref. Rel Amp Segments Distance Velocity Ref. Area    ms ms mV mV %  cm m/s m/s mVms  R Peroneal - EDB     Ankle EDB 3.5 ?6.5 7.4 ?2.0 100 Ankle - EDB 9   16.7     Fib head EDB 8.4  6.2  83.4 Fib head - Ankle 26 54 ?44 15.2     Pop fossa EDB 10.3  5.2  84.4 Pop fossa - Fib head 10 53 ?44 13.0         Pop fossa - Ankle      L Peroneal - EDB     Ankle EDB 3.4 ?6.5 7.0 ?2.0 100 Ankle - EDB 9   18.5     Fib head EDB 8.3  5.9  84.6 Fib head - Ankle 26 54 ?44 18.7     Pop fossa EDB 10.1  3.5  59 Pop fossa - Fib head 10 54 ?44 11.4         Pop fossa - Ankle      R Tibial - AH     Ankle AH 3.9 ?5.8 4.6 ?4.0 100 Ankle - AH 9   7.2     Pop fossa AH 10.9  3.7  80.5 Pop fossa - Ankle 34 49 ?41 8.2  L Tibial - AH     Ankle AH 3.8 ?5.8 4.0 ?4.0 100 Ankle - AH 9   6.2     Pop fossa AH 10.4  2.7  67.8 Pop fossa - Ankle 34 52 ?41 6.9             SNC    Nerve / Sites Rec. Site Peak Lat Ref.  Amp Ref. Segments Distance    ms ms V V  cm  R Sural - Ankle (Calf)     Calf Ankle 3.6 ?4.4 7 ?6 Calf - Ankle 14  L Sural - Ankle (Calf)     Calf Ankle 3.3 ?4.4 14 ?6 Calf - Ankle 14  R Superficial peroneal - Ankle     Lat leg Ankle 3.2 ?4.4 7 ?6 Lat leg - Ankle 14  L Superficial peroneal - Ankle     Lat leg Ankle 3.3 ?4.4 11 ?6 Lat leg - Ankle 14             F  Wave    Nerve F Lat Ref.   ms ms  R Tibial - AH 43.1 ?56.0  L Tibial - AH 43.9 ?56.0

## 2019-03-11 NOTE — Progress Notes (Signed)
Please refer to EMG and nerve conduction procedure note.  

## 2019-03-12 ENCOUNTER — Telehealth: Payer: Self-pay

## 2019-03-12 NOTE — Telephone Encounter (Signed)
Patient is returning your call and can be reached @ [phone]

## 2019-03-12 NOTE — Telephone Encounter (Signed)
I called to make the patient aware that her apt with [facility] would be canceled. She did not answer so I left a VM asking her to call us back. If she calls back please inform her of the information below and that apt is canceled at [facility]. Also let her know we will work on auth and send to [facility] provider.

## 2019-03-12 NOTE — Telephone Encounter (Signed)
I spoke with Tonya Campbell from [facility] who stated that the patients [employer] plans is requiring she go to a [employer]. It will also require authorization

## 2019-03-13 ENCOUNTER — Other Ambulatory Visit: Payer: Self-pay | Admitting: Neurology

## 2019-03-13 NOTE — Telephone Encounter (Signed)
Patient called back requesting to speak with someone regarding her MRI but when I tried to pick up the call it disconnected. I called the patient back and she stated she has been receiving procedures at [facility] all year.

## 2019-03-13 NOTE — Telephone Encounter (Signed)
I called and made the patient aware that [facility] would be reaching back out to schedule. DW

## 2019-03-13 NOTE — Telephone Encounter (Signed)
I called and spoke with Taron with GI to let her know what the patient told me, she stated that she would get authorization and call and schedule the patient. DW

## 2019-03-13 NOTE — Telephone Encounter (Signed)
Pt has called  Back and the message from [place] were relayed to her.  No call back requested

## 2019-03-13 NOTE — Telephone Encounter (Signed)
Noted, thank you. DW  °

## 2019-03-13 NOTE — Telephone Encounter (Signed)
I called the patient again to make her aware of the information below but she did not answer so LVM. If she calls back, PLEASE MAKE HERE AWARE OF APT CANCELLATION.

## 2019-03-18 ENCOUNTER — Other Ambulatory Visit: Payer: PRIVATE HEALTH INSURANCE

## 2019-03-19 ENCOUNTER — Telehealth: Payer: Self-pay | Admitting: Neurology

## 2019-03-19 NOTE — Telephone Encounter (Signed)
Good morning ladies, MRN [id number] authorization with [employer] was denied for the following reason: Nerve conduction studies were normal and EMGs failed to show any evidence of neuropathy. There is no documentation that the patient has completed six weeks of recent medically supervised conservative therapy. Appeals options available: standard or p2p**MUST SCHEDULE IN ADVANCE** call Nurse Kristine Garbe at [phone] option 1 ext [phone], her fax [phone].   The patient stated she wanted to come regardless and will be self pay, so we are going to reschedule her for as soon as something is available. Thank you    This message is from [place].  Thanks Hinton Dyer

## 2019-03-29 ENCOUNTER — Other Ambulatory Visit: Payer: Self-pay

## 2019-03-29 ENCOUNTER — Ambulatory Visit
Admission: RE | Admit: 2019-03-29 | Discharge: 2019-03-29 | Disposition: A | Discharge status: Home / Self Care | Payer: PRIVATE HEALTH INSURANCE | Source: Ambulatory Visit | Attending: Neurology | Admitting: Neurology

## 2019-03-29 ENCOUNTER — Other Ambulatory Visit: Payer: PRIVATE HEALTH INSURANCE

## 2019-03-29 DIAGNOSIS — G8929 Other chronic pain: Secondary | ICD-10-CM

## 2019-03-29 DIAGNOSIS — M5441 Lumbago with sciatica, right side: Secondary | ICD-10-CM

## 2019-04-12 ENCOUNTER — Telehealth: Payer: Self-pay | Admitting: Neurology

## 2019-04-12 NOTE — Telephone Encounter (Signed)
Pt has called to inform she had her MRI on 01-08 and she would very much like to be contacted just as soon as the results are available

## 2019-04-15 NOTE — Telephone Encounter (Signed)
I can see the films but will not be able to do report as I am off site and using a small laptop. Kindly let Dr Epimenio Foot or [facility] read and report

## 2019-04-17 NOTE — Telephone Encounter (Signed)
Thanks

## 2019-04-25 NOTE — Telephone Encounter (Signed)
MRI images still not available for MD to read. Called [facility], spoke with Tracy. She called MRI tech then advised me he was uploading MRI lumbar spine  images now.

## 2019-04-25 NOTE — Addendum Note (Signed)
Encounter addended by: Harriett Sine on: 04/25/2019 12:00 PM  Actions taken: Imaging Exam ended

## 2019-04-26 NOTE — Telephone Encounter (Signed)
Patient called back in regards to the VM left and would like to go with neurotin and would like it sent to the  [facility] on [address] [place].

## 2019-04-26 NOTE — Telephone Encounter (Signed)
I called the patient.  MRI of the low back by my reading appears to be relatively unremarkable, I do not see clear evidence of nerve root impingement or spinal stenosis.  EMG and nerve conduction study evaluation was completely normal.  The patient has been to physical therapy previously, if she is still having significant low back pain, she will call us and we will consider addition of medication such as Cymbalta or gabapentin or Lyrica for the chronic pain.

## 2019-04-26 NOTE — Telephone Encounter (Signed)
I will call the patient back if the formal reading of the MRI of the lumbar spine is markedly different from what I have told her from my reading.

## 2019-04-29 ENCOUNTER — Telehealth: Payer: Self-pay | Admitting: Neurology

## 2019-04-29 ENCOUNTER — Other Ambulatory Visit: Payer: Self-pay

## 2019-04-29 MED ORDER — GABAPENTIN 100 MG PO CAPS: 100.0000 mg | ORAL_CAPSULE | Freq: Every day | ORAL | 0 refills | Status: DC

## 2019-04-29 NOTE — Telephone Encounter (Signed)
I reviewed her chart and recent phone note from January 2021. Please advise patient that she can start low-dose gabapentin at night, 100 mg strength, please advise patient that it can be sedating, and cause dizziness.  She has no appointment pending for Dr. Anne Hahn or his nurse practitioner for follow-up.  I have provided a 1 month prescription with no refills. Please advise her to make an appointment for follow-up

## 2019-04-29 NOTE — Telephone Encounter (Signed)
Pt called and wanted to inform provider that she would like to be started on the Neurontin and would like it called in to the [facility] on [address].

## 2019-04-29 NOTE — Telephone Encounter (Signed)
I called pt that gabapentin was sent it. Pt requested it go to [facility] on [address]. Medication was resent. I call pt and text her the link for a mychart video visit with March. Pt was advise to create an account and log on and send a message if needed. I advise pt to log on 5 to 10 minutes early. She verbalized understanding.

## 2019-05-28 ENCOUNTER — Other Ambulatory Visit: Payer: Self-pay

## 2019-05-28 ENCOUNTER — Telehealth: Payer: Self-pay | Admitting: Neurology

## 2019-05-28 MED ORDER — GABAPENTIN 100 MG PO CAPS: 100.0000 mg | ORAL_CAPSULE | Freq: Every day | ORAL | 2 refills | Status: DC

## 2019-05-28 NOTE — Telephone Encounter (Signed)
Reill sent of gabapentin to [facility] per pt request until appt.

## 2019-05-28 NOTE — Telephone Encounter (Signed)
Pt is asking for a refill on her gabapentin (NEURONTIN) 100 MG capsule [facility] [phone]

## 2019-06-03 ENCOUNTER — Telehealth: Payer: Self-pay | Admitting: Neurology

## 2019-06-03 NOTE — Progress Notes (Deleted)
Virtual Visit via Video Note  I connected with [name] on 06/03/19 at  2:15 PM EDT by a video enabled telemedicine application and verified that I am speaking with the correct person using two identifiers.  Location: Patient: *** Provider: ***   I discussed the limitations of evaluation and management by telemedicine and the availability of in person appointments. The patient expressed understanding and agreed to proceed.  History of Present Illness: 06/03/2019 SS: Ms. Treese is a 44 year old female with history of anxiety and burning and leg cramps.  X-ray of the lumbar spine showed some degenerative changes at the S1-S2 level.  Nerve conduction evaluation was completely normal.  MRI of the lumbar spine was relatively unremarkable.  12/12/2018 Dr. Anne Hahn: Ms. Market is a 44 year old right-handed black female with a history of significant anxiety issues.  The patient may miss work occasionally because of anxiety episodes.  She has previously had some problems with feeling as if she had "brain fog", but this has since cleared.  The patient is having problems at nighttime with jerking and twitching of the legs, she also has muscle cramps at night and during the daytime.  She has been placed on Mirapex without much benefit, she takes Robaxin for the muscle cramps which does help.  The patient reports that since a fall on ice 12 years ago, she has had back pain and some discomfort down the right leg that has gradually worsened over time.  She has had increasing problems with burning and discomfort in the thighs bilaterally, when she walks she will have fatigue of the legs.  She has not had any true weakness or falls, she denies any balance issues.  She has no problems with using the arms.  She denies issues controlling the bowels or the bladder.  She has undergone physical therapy and chiropractic treatments without much benefit.  She is sent to this office for an evaluation.    Observations/Objective:   Assessment and Plan:   Follow Up Instructions:    I discussed the assessment and treatment plan with the patient. The patient was provided an opportunity to ask questions and all were answered. The patient agreed with the plan and demonstrated an understanding of the instructions.   The patient was advised to call back or seek an in-person evaluation if the symptoms worsen or if the condition fails to improve as anticipated.  I provided *** minutes of non-face-to-face time during this encounter.   Glean Salvo, NP

## 2019-07-02 ENCOUNTER — Other Ambulatory Visit: Payer: Self-pay

## 2019-07-02 ENCOUNTER — Telehealth: Payer: Self-pay | Admitting: Neurology

## 2019-07-02 MED ORDER — GABAPENTIN 100 MG PO CAPS: 100.0000 mg | ORAL_CAPSULE | Freq: Every day | ORAL | 3 refills | Status: DC

## 2019-07-02 NOTE — Telephone Encounter (Signed)
Pt is needing a refill on her gabapentin (NEURONTIN) 100 MG capsule sent in to the [facility] on [address].

## 2019-07-02 NOTE — Telephone Encounter (Signed)
Refill sent for 90 days.  

## 2019-07-22 ENCOUNTER — Encounter: Payer: Self-pay | Admitting: Neurology

## 2019-07-22 ENCOUNTER — Telehealth (INDEPENDENT_AMBULATORY_CARE_PROVIDER_SITE_OTHER): Payer: 59 | Admitting: Neurology

## 2019-07-22 DIAGNOSIS — G8929 Other chronic pain: Secondary | ICD-10-CM

## 2019-07-22 DIAGNOSIS — M5440 Lumbago with sciatica, unspecified side: Secondary | ICD-10-CM

## 2019-07-22 DIAGNOSIS — M5431 Sciatica, right side: Secondary | ICD-10-CM | POA: Diagnosis not present

## 2019-07-22 DIAGNOSIS — M549 Dorsalgia, unspecified: Secondary | ICD-10-CM | POA: Insufficient documentation

## 2019-07-22 MED ORDER — GABAPENTIN 300 MG PO CAPS: 300.0000 mg | ORAL_CAPSULE | Freq: Two times a day (BID) | ORAL | 3 refills | Status: DC

## 2019-07-22 NOTE — Progress Notes (Deleted)
Virtual Visit via Video Note  I connected with Tonya Campbell on 07/22/19 at  9:45 AM EDT by a video enabled telemedicine application and verified that I am speaking with the correct person using two identifiers.  Location: Patient: *** Provider: ***   I discussed the limitations of evaluation and management by telemedicine and the availability of in person appointments. The patient expressed understanding and agreed to proceed.  History of Present Illness: 07/22/2019 SS: Tonya Campbell is a 44 year old female with history of significant anxiety issues, restless leg symptoms, back and right leg pain.  After last visit, she was sent for x-ray of the lumbar spine, no prior fractions, some degenerative changes at the S1-S2 level.  Nerve conduction evaluation was completely normal of the right leg.  MRI of the lumbar spine was relatively unremarkable.  She completed physical therapy.  She was started on gabapentin.  12/12/2018 Dr. Anne Hahn: Tonya Campbell is a 44 year old right-handed black female with a history of significant anxiety issues.  The patient may miss work occasionally because of anxiety episodes.  She has previously had some problems with feeling as if she had "brain fog", but this has since cleared.  The patient is having problems at nighttime with jerking and twitching of the legs, she also has muscle cramps at night and during the daytime.  She has been placed on Mirapex without much benefit, she takes Robaxin for the muscle cramps which does help.  The patient reports that since a fall on ice 12 years ago, she has had back pain and some discomfort down the right leg that has gradually worsened over time.  She has had increasing problems with burning and discomfort in the thighs bilaterally, when she walks she will have fatigue of the legs.  She has not had any true weakness or falls, she denies any balance issues.  She has no problems with using the arms.  She denies issues controlling the  bowels or the bladder.  She has undergone physical therapy and chiropractic treatments without much benefit.  She is sent to this office for an evaluation.   Observations/Objective:   Assessment and Plan:   Follow Up Instructions:    I discussed the assessment and treatment plan with the patient. The patient was provided an opportunity to ask questions and all were answered. The patient agreed with the plan and demonstrated an understanding of the instructions.   The patient was advised to call back or seek an in-person evaluation if the symptoms worsen or if the condition fails to improve as anticipated.  I provided *** minutes of non-face-to-face time during this encounter.   Glean Salvo, NP

## 2019-07-22 NOTE — Progress Notes (Signed)
Virtual Visit via Video Note  I connected with Tonya Campbell on 07/22/19 at  9:45 AM EDT by a video enabled telemedicine application and verified that I am speaking with the correct person using two identifiers.  Location: Patient: at her home Provider: in the office    I discussed the limitations of evaluation and management by telemedicine and the availability of in person appointments. The patient expressed understanding and agreed to proceed.  History of Present Illness: 07/22/2019 SS: Tonya Campbell is a 44 year old female with history of significant anxiety issues, restless leg symptoms, back and leg pain.  After last visit, she was sent for x-ray of the lumbar spine, no prior fractions, some degenerative changes at the S1-S2 level.  Nerve conduction evaluation was completely normal of the right leg.  MRI of the lumbar spine was relatively unremarkable.  She completed physical therapy.  She was started on gabapentin.  She is now taking gabapentin 100 mg at bedtime.  Initially noticed good benefit, but now feels the medication is worn off.  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  Feels her back and leg pain is still unpredictable, it is difficult for her to work full-time.  She has not had any falls.  She denies any urinary or bowel incontinence, but will have some stress urinary incontinence if sneezing or coughing.  She feels her low back pain and leg pain is constant, is worsened by prolonged standing.  The pain radiates down both legs, she will have burning in her upper thighs and stiffness.  She also takes Robaxin as needed to help with muscle spasms.  She is on daily meloxicam.  She has previously tried physical therapy and chiropractor without much benefit.  She presents today for evaluation via virtual visit.  12/12/2018 Dr. Anne Hahn: Tonya Campbell is a 44 year old right-handed black female with a history of  significant anxiety issues.  The patient may miss work occasionally because of anxiety episodes.  She has previously had some problems with feeling as if she had "brain fog", but this has since cleared.  The patient is having problems at nighttime with jerking and twitching of the legs, she also has muscle cramps at night and during the daytime.  She has been placed on Mirapex without much benefit, she takes Robaxin for the muscle cramps which does help.  The patient reports that since a fall on ice 12 years ago, she has had back pain and some discomfort down the right leg that has gradually worsened over time.  She has had increasing problems with burning and discomfort in the thighs bilaterally, when she walks she will have fatigue of the legs.  She has not had any true weakness or falls, she denies any balance issues.  She has no problems with using the arms.  She denies issues controlling the bowels or the bladder.  She has undergone physical therapy and chiropractic treatments without much benefit.  She is sent to this office for an evaluation.   Observations/Objective: Via virtual visit, is alert and oriented, speech is clear and concise, facial symmetry noted, no arm drift, follows commands well, gait appears intact, appears slightly limping on the right, no assistive device was noted  Assessment and Plan: 1.  History of back pain, leg pain, right and left  2.  Burning discomfort in the thighs bilaterally, fatigue of the legs 3.  Anxiety disorder  She continues to remain symptomatic.  MRI of the lumbar spine was relatively unremarkable, no clear evidence of nerve root impingement or spinal stenosis.  Nerve conduction/ EMG of the right leg was completely normal.  We will increase her gabapentin up to 300 mg twice daily.  I will see her back in 3 months.  We could possibly add Cymbalta, or even consider if she may be a candidate for epidural steroid injection.  Hesitant to increase gabapentin too  high, she is concerned for side effect of drowsiness during daytime.  MRI Lumbar Spine 03/29/2019 IMPRESSION: This MRI of the lumbar spine without contrast shows the following: 1.   At L4-L5, there are degenerative changes causing moderate left foraminal narrowing but no nerve root compression or spinal stenosis. 2.   At L5-S1, there is a small right paramedian disc herniation causing mild to moderate right foraminal narrowing and moderately severe right lateral recess stenosis.  The disc contacts the right S1 nerve root without causing definite compression. 3.   There are milder degenerative changes at L2-L3 and L3-L4 that do not lead to significant foraminal or lateral recess narrowing, nerve root compression or spinal stenosis.  Follow Up Instructions: 3 months 10/22/2019 3:15   I discussed the assessment and treatment plan with the patient. The patient was provided an opportunity to ask questions and all were answered. The patient agreed with the plan and demonstrated an understanding of the instructions.   The patient was advised to call back or seek an in-person evaluation if the symptoms worsen or if the condition fails to improve as anticipated.  I spent 30 minutes of face-to-face and non-face-to-face time with patient.  This included previsit chart review, lab review, study review, order entry, electronic health record documentation, patient education.   Evangeline Dakin, DNP  [facility] [address] [place], [place] [address] [phone]

## 2019-07-24 NOTE — Progress Notes (Signed)
I have read the note, and I agree with the clinical assessment and plan.  Amaryllis Malmquist K Genetta Fiero   

## 2019-08-14 ENCOUNTER — Telehealth: Payer: Self-pay | Admitting: Neurology

## 2019-08-14 DIAGNOSIS — M5431 Sciatica, right side: Secondary | ICD-10-CM

## 2019-08-14 NOTE — Telephone Encounter (Signed)
I ordered referral to IR, for lumbar ESI.

## 2019-08-14 NOTE — Telephone Encounter (Signed)
Pt called to check on the status of possibly getting epidural shots for her back and if so what she needs to do in order to move forward

## 2019-08-14 NOTE — Telephone Encounter (Signed)
We can offer ESI lumbar spine for her back pain. If interested can order to be done at GI.

## 2019-08-14 NOTE — Addendum Note (Signed)
Addended by: Glean Salvo on: 08/14/2019 03:04 PM   Modules accepted: Orders

## 2019-08-14 NOTE — Telephone Encounter (Signed)
I called pt and she is ok to proceed with the steroid injections as the gabapentin did not really do much for her.  I relayed order will be placed and then authorization done then GI facility will call you to schedule.  She verbalized understanding.

## 2019-08-14 NOTE — Telephone Encounter (Signed)
Order has been sent to [facility] at GI to sch telephone [phone] fax [phone]

## 2019-08-15 ENCOUNTER — Other Ambulatory Visit: Payer: Self-pay | Admitting: Neurology

## 2019-08-15 DIAGNOSIS — G8929 Other chronic pain: Secondary | ICD-10-CM

## 2019-08-15 NOTE — Telephone Encounter (Signed)
Pt is asking for a call to discuss the approval of the epidural shots at [facility] thru her insurance.

## 2019-08-20 NOTE — Telephone Encounter (Signed)
Called and left patient a message relaying that she would need to speak to [facility]. [facility] handled the authorization process. [phone]. Thanks Annabelle Harman

## 2019-08-28 ENCOUNTER — Telehealth: Payer: Self-pay | Admitting: Neurology

## 2019-08-28 ENCOUNTER — Other Ambulatory Visit: Payer: Self-pay

## 2019-08-28 ENCOUNTER — Ambulatory Visit
Admission: RE | Admit: 2019-08-28 | Discharge: 2019-08-28 | Disposition: A | Discharge status: Home / Self Care | Payer: 59 | Source: Ambulatory Visit | Attending: Neurology | Admitting: Neurology

## 2019-08-28 DIAGNOSIS — G8929 Other chronic pain: Secondary | ICD-10-CM

## 2019-08-28 MED ORDER — METHYLPREDNISOLONE ACETATE 40 MG/ML INJ SUSP (RADIOLOG
120.0000 mg | Freq: Once | INTRAMUSCULAR | Status: AC
  Administered 2019-08-28: 120 mg via EPIDURAL

## 2019-08-28 MED ORDER — IOPAMIDOL (ISOVUE-M 200) INJECTION 41%
1.0000 mL | Freq: Once | INTRAMUSCULAR | Status: AC
  Administered 2019-08-28: 1 mL via EPIDURAL

## 2019-08-28 NOTE — Discharge Instructions (Signed)

## 2019-08-28 NOTE — Telephone Encounter (Signed)
Pt has done her lumbar puncture and is wanting to know if she is needing a follow up. Also pt is needing a refill on her gabapentin (NEURONTIN) 300 MG capsule sent to the [facility] on [address].

## 2019-08-29 NOTE — Telephone Encounter (Signed)
LMVM for pt that she does not need f/u after the ESI, keep the 10-22-19 at 1515 already scheduled.  Should have refills for gabapentin on file at pharmacy.  Please call back if needed.

## 2019-10-02 ENCOUNTER — Telehealth: Payer: Self-pay | Admitting: Neurology

## 2019-10-02 NOTE — Telephone Encounter (Signed)
Patient called stating that her PCP is not in the [facility] network and was wondering if she can have the medical record formed emailed to her so she can get recorders sent to her to PCP.

## 2019-10-22 ENCOUNTER — Ambulatory Visit: Payer: Self-pay | Admitting: Neurology

## 2019-10-22 ENCOUNTER — Other Ambulatory Visit: Payer: Self-pay

## 2019-10-22 ENCOUNTER — Encounter: Payer: Self-pay | Admitting: Neurology

## 2019-10-22 VITALS — BP 116/78 | HR 65 | Ht 61.0 in | Wt 169.0 lb

## 2019-10-22 DIAGNOSIS — G8929 Other chronic pain: Secondary | ICD-10-CM

## 2019-10-22 DIAGNOSIS — M5440 Lumbago with sciatica, unspecified side: Secondary | ICD-10-CM

## 2019-10-22 MED ORDER — GABAPENTIN 300 MG PO CAPS: 300.0000 mg | ORAL_CAPSULE | Freq: Two times a day (BID) | ORAL | 5 refills | Status: DC

## 2019-10-22 NOTE — Progress Notes (Signed)
I have read the note, and I agree with the clinical assessment and plan.  Tonya Campbell   

## 2019-10-22 NOTE — Progress Notes (Signed)
PATIENT: Tonya Campbell DOB: [birth year]  REASON FOR VISIT: follow up HISTORY FROM: patient  HISTORY OF PRESENT ILLNESS: Today 10/22/19  Tonya Campbell is a 44 year old female with history of significant anxiety, restless leg symptoms, back and leg pain. She was sent for x-ray of the lumbar spine, no prior fractions, some degenerative changes at the S1-S2 level.  Nerve conduction evaluation was completely normal of the right leg.  MRI of the lumbar spine was relatively unremarkable.  She completed physical therapy. She received lumbar ESI on August 28, 2019.  She is taking gabapentin 300 mg twice a day. ESI resulted in 50% improvement in pain. 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  Pain level is 3/10.  Continues to do her exercises and stretching.  Mostly complains of low back pain, radiates down the right leg, can go down the left leg.  Is taking Robaxin at night, for muscle spasms.  She is seeing a Land.  No changes to the bowels or bladder.  Overall, symptoms are stable, improved following ESI.  Presents today for follow-up unaccompanied.  HISTORY 07/22/2019 SS: Tonya Campbell is a 44 year old female with history of significant anxiety issues, restless leg symptoms, back and leg pain.  After last visit, she was sent for x-ray of the lumbar spine, no prior fractions, some degenerative changes at the S1-S2 level.  Nerve conduction evaluation was completely normal of the right leg.  MRI of the lumbar spine was relatively unremarkable.  She completed physical therapy.  She was started on gabapentin.  She is now taking gabapentin 100 mg at bedtime.  Initially noticed good benefit, but now feels the medication is worn off.  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  Feels her back and leg pain is still unpredictable, it is difficult for her to work full-time.  She has not had any falls.   She denies any urinary or bowel incontinence, but will have some stress urinary incontinence if sneezing or coughing.  She feels her low back pain and leg pain is constant, is worsened by prolonged standing.  The pain radiates down both legs, she will have burning in her upper thighs and stiffness.  She also takes Robaxin as needed to help with muscle spasms.  She is on daily meloxicam.  She has previously tried physical therapy and chiropractor without much benefit.  She presents today for evaluation via virtual visit.   REVIEW OF SYSTEMS: Out of a complete 14 system review of symptoms, the patient complains only of the following symptoms, and all other reviewed systems are negative.  Back pain  ALLERGIES: No Known Allergies  HOME MEDICATIONS: Outpatient Medications Prior to Visit  Medication Sig Dispense Refill  . ALPRAZolam (XANAX) 0.25 MG tablet     . meloxicam (MOBIC) 15 MG tablet     . methocarbamol (ROBAXIN) 500 MG tablet Take 2 tablets (1,000 mg total) by mouth 4 (four) times daily as needed (Pain). 20 tablet 0  . Multiple Vitamin (MULTIVITAMIN WITH MINERALS) TABS tablet Take 1 tablet by mouth daily.    Marland Kitchen OVER THE COUNTER MEDICATION Place 1 patch onto the skin daily as needed (For back pain.). Well Patch Backache Pain Relief Patch    . sertraline (ZOLOFT) 100 MG tablet     . VITAMIN D PO Take 2,000 Units by mouth.    Marland Kitchen  gabapentin (NEURONTIN) 300 MG capsule Take 1 capsule (300 mg total) by mouth 2 (two) times daily. 60 capsule 3  . rOPINIRole (REQUIP) 0.25 MG tablet      No facility-administered medications prior to visit.    PAST MEDICAL HISTORY: Past Medical History:  Diagnosis Date  . Anxiety   . Back pain   . Dizziness   . RLS (restless legs syndrome)     PAST SURGICAL HISTORY: Past Surgical History:  Procedure Laterality Date  . APPENDECTOMY    . CARPAL TUNNEL RELEASE Right     FAMILY HISTORY: Family History  Problem Relation Age of Onset  . Diabetes Mother     . High blood pressure Mother   . Pancreatic cancer Father   . Breast cancer Maternal Grandmother   . Breast cancer Paternal Grandmother     SOCIAL HISTORY: Social History   Socioeconomic History  . Marital status: Single    Spouse name: Not on file  . Number of children: Not on file  . Years of education: Not on file  . Highest education level: Not on file  Occupational History  . Not on file  Tobacco Use  . Smoking status: Never Smoker  . Smokeless tobacco: Never Used  Substance and Sexual Activity  . Alcohol use: No  . Drug use: No  . Sexual activity: Not on file  Other Topics Concern  . Not on file  Social History Narrative   Right handed    Caffeine~ 1 cup per day    Live at home alone    Social Determinants of Health   Financial Resource Strain:   . Difficulty of Paying Living Expenses:   Food Insecurity:   . Worried About Programme researcher, broadcasting/film/video in the Last Year:   . Barista in the Last Year:   Transportation Needs:   . Freight forwarder (Medical):   Marland Kitchen Lack of Transportation (Non-Medical):   Physical Activity:   . Days of Exercise per Week:   . Minutes of Exercise per Session:   Stress:   . Feeling of Stress :   Social Connections:   . Frequency of Communication with Friends and Family:   . Frequency of Social Gatherings with Friends and Family:   . Attends Religious Services:   . Active Member of Clubs or Organizations:   . Attends Banker Meetings:   Marland Kitchen Marital Status:   Intimate Partner Violence:   . Fear of Current or Ex-Partner:   . Emotionally Abused:   Marland Kitchen Physically Abused:   . Sexually Abused:    PHYSICAL EXAM  Vitals:   10/22/19 1458  BP: 116/78  Pulse: 65  Weight: 169 lb (76.7 kg)  Height: 5\' 1"  (1.549 m)   Body mass index is 31.93 kg/m.  Generalized: Well developed, in no acute distress   Neurological examination  Mentation: Alert oriented to time, place, history taking. Follows all commands speech and  language fluent Cranial nerve II-XII: Pupils were equal round reactive to light. Extraocular movements were full, visual field were full on confrontational test. Facial sensation and strength were normal. Head turning and shoulder shrug  were normal and symmetric. Motor: The motor testing reveals 5 over 5 strength of all 4 extremities. Good symmetric motor tone is noted throughout.  Sensory: Sensory testing is intact to soft touch on all 4 extremities. No evidence of extinction is noted.  Coordination: Cerebellar testing reveals good finger-nose-finger and heel-to-shin bilaterally.  Gait and station:  Gait is normal. Tandem gait is normal. Romberg is negative. No drift is seen.  Reflexes: Deep tendon reflexes are symmetric and normal bilaterally.   DIAGNOSTIC DATA (LABS, IMAGING, TESTING) - I reviewed patient records, labs, notes, testing and imaging myself where available.  Lab Results  Component Value Date   HGB 14.3 02/05/2015   HCT 42.0 02/05/2015      Component Value Date/Time   NA 138 02/05/2015 1146   K 4.4 02/05/2015 1146   CL 102 02/05/2015 1146   GLUCOSE 93 02/05/2015 1146   BUN 14 02/05/2015 1146   CREATININE 0.90 02/05/2015 1146   No results found for: CHOL, HDL, LDLCALC, LDLDIRECT, TRIG, CHOLHDL No results found for: EHMC9O No results found for: VITAMINB12 No results found for: TSH  ASSESSMENT AND PLAN 44 y.o. year old female  has a past medical history of Anxiety, Back pain, Dizziness, and RLS (restless legs syndrome). here with:  1.  History of low back pain, leg pain, right and left 2.  Anxiety disorder -MRI of lumbar spine was relatively unremarkable, no clear evidence of nerve root impingement or spinal stenosis -Nerve conduction of the right leg was completely normal -ESI of the lumbar spine resulted in 50% improvement  -Continue gabapentin 300 mg twice daily, could add in Cymbalta if needed or repeat ESI if needed -May consider surgical opinion if pain is  difficult to control, affecting her ability to work, for now she is returning to working full-time -Return in 6 months or sooner if needed  I spent 30 minutes of face-to-face and non-face-to-face time with patient.  This included previsit chart review, lab review, study review, order entry, electronic health record documentation, patient education.  Margie Ege, AGNP-C, DNP 10/22/2019, 3:31 PM [facility] [address], [address] [place], [place] [address] [phone]

## 2019-10-22 NOTE — Patient Instructions (Signed)
Continue current medications  Continue exercise and stretching See you back in 6 months

## 2019-11-01 ENCOUNTER — Telehealth: Payer: Self-pay | Admitting: Neurology

## 2019-11-01 DIAGNOSIS — M5442 Lumbago with sciatica, left side: Secondary | ICD-10-CM

## 2019-11-01 DIAGNOSIS — G8929 Other chronic pain: Secondary | ICD-10-CM

## 2019-11-01 NOTE — Telephone Encounter (Signed)
Pt called stating that she would like to know if she can be scheduled an Epidural Spinal Inj. due to the numbing and pain she is having in her legs that seems to be getting worse. Please advise.

## 2019-11-04 NOTE — Telephone Encounter (Signed)
Pt called again wanting to know if she can get a call back today regarding this. Please advise.

## 2019-11-04 NOTE — Telephone Encounter (Signed)
I called the patient.  She has had some worsening in her lower extremity discomfort, prior epidural steroid injection did help some, she wishes to repeat this, I will try to get this set up.

## 2019-11-05 ENCOUNTER — Other Ambulatory Visit: Payer: Self-pay | Admitting: Neurology

## 2019-11-05 DIAGNOSIS — G8929 Other chronic pain: Secondary | ICD-10-CM

## 2019-11-12 ENCOUNTER — Other Ambulatory Visit: Payer: 59

## 2019-11-14 ENCOUNTER — Encounter: Payer: Self-pay | Admitting: *Deleted

## 2019-11-14 ENCOUNTER — Ambulatory Visit
Admission: RE | Admit: 2019-11-14 | Discharge: 2019-11-14 | Disposition: A | Discharge status: Home / Self Care | Payer: 59 | Source: Ambulatory Visit | Attending: Neurology | Admitting: Neurology

## 2019-11-14 ENCOUNTER — Telehealth: Payer: Self-pay | Admitting: Neurology

## 2019-11-14 DIAGNOSIS — M5442 Lumbago with sciatica, left side: Secondary | ICD-10-CM

## 2019-11-14 DIAGNOSIS — G8929 Other chronic pain: Secondary | ICD-10-CM

## 2019-11-14 MED ORDER — IOPAMIDOL (ISOVUE-M 200) INJECTION 41%
1.0000 mL | Freq: Once | INTRAMUSCULAR | Status: AC
  Administered 2019-11-14: 1 mL via EPIDURAL

## 2019-11-14 MED ORDER — METHYLPREDNISOLONE ACETATE 40 MG/ML INJ SUSP (RADIOLOG
120.0000 mg | Freq: Once | INTRAMUSCULAR | Status: AC
  Administered 2019-11-14: 120 mg via EPIDURAL

## 2019-11-14 NOTE — Telephone Encounter (Signed)
Pt called wanting to know if she can get a note for work tomorrow due to getting her Lumbar Epidural today and having a different reaction to this dose. Pt does not think she can stand 12 hours at work feeling this way. Please advise.

## 2019-11-14 NOTE — Discharge Instructions (Signed)

## 2019-11-14 NOTE — Telephone Encounter (Signed)
Okay to give a note to be out of work tomorrow.

## 2019-11-14 NOTE — Telephone Encounter (Signed)
Sent thru [facility] and I relayed to pt that was done and she should be able to get they mychart.

## 2019-11-14 NOTE — Telephone Encounter (Signed)
I called pt and she had epidural injection into her back at GI this am at 0900.  She stated he placed lower L5S1 area then previous injection and is experiencing a lot discomfort and pressure.  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  Please advise if ok and if so can do on mychart.

## 2019-11-19 ENCOUNTER — Telehealth: Payer: Self-pay | Admitting: Neurology

## 2019-11-19 DIAGNOSIS — M5441 Lumbago with sciatica, right side: Secondary | ICD-10-CM

## 2019-11-19 DIAGNOSIS — G8929 Other chronic pain: Secondary | ICD-10-CM

## 2019-11-19 NOTE — Telephone Encounter (Signed)
She has had 2 ESI's most recent August 26. She is requesting a neurosurgical consult, I will place a referral to see if any options are available to her. She can't tolerate higher dose of gabapentin due to side effect she claims, we haven't tried Cymbalta, she has been hesitant.

## 2019-11-19 NOTE — Telephone Encounter (Signed)
Pt called wanting to inform the provider that she has decided to go ahead and get the referral for the Spinal Surgeon. Pt would like to be called once it has been sent in for her and approved. Please advise.

## 2019-11-27 ENCOUNTER — Telehealth: Payer: Self-pay | Admitting: Neurology

## 2019-11-27 NOTE — Telephone Encounter (Signed)
I spoke to pharmacy then pt and relayed that her gabapentin has refills and she is able to get 5 refills.  The pharmacy is getting it ready for her.  She appreciated call.

## 2019-11-27 NOTE — Telephone Encounter (Signed)
Pt request refill gabapentin (NEURONTIN) 300 MG capsule at [facility] #[id number]

## 2019-12-10 ENCOUNTER — Telehealth: Payer: Self-pay | Admitting: Neurology

## 2019-12-10 NOTE — Telephone Encounter (Signed)
Pt called, having nerve pain in right leg. Had to leave work. Would like a call from the nurse.

## 2019-12-10 NOTE — Telephone Encounter (Signed)
Called and LMVM for pt that returned call. 

## 2019-12-10 NOTE — Telephone Encounter (Signed)
Pt returned phone call.  

## 2019-12-11 ENCOUNTER — Other Ambulatory Visit: Payer: Self-pay | Admitting: Neurology

## 2019-12-11 MED ORDER — GABAPENTIN 300 MG PO CAPS: 600.0000 mg | ORAL_CAPSULE | Freq: Two times a day (BID) | ORAL | 5 refills | Status: DC

## 2019-12-11 NOTE — Telephone Encounter (Signed)
I called pt and she is still having issues with her R leg pain,  Has had 2nd steroid inj 11-14-19, challenging with work, saw [place] [facility] recommended 3rd injection. Pt wants to hold on this for now.  Would like to try increase gabapentin, not use differenct drug at this time.  Please advise.

## 2019-12-11 NOTE — Telephone Encounter (Signed)
She is only taking low dose gabapentin 300 mg BID. We have plenty of room to increase, if she can tolerate. We can go to 3 times daily or increase to 600 mg twice daily, lots of options, even 300 mg twice daily and 600 mg at bedtime.

## 2019-12-17 ENCOUNTER — Telehealth: Payer: Self-pay | Admitting: Neurology

## 2019-12-17 NOTE — Telephone Encounter (Signed)
Received PA request from pharmacy for gabapentin 300mg . PA was started on . Key is [id number]. Per [employer]MM[employer], we should receive a determination within the next 24-48 hours. Will check back later for a response.

## 2019-12-18 NOTE — Telephone Encounter (Signed)
Per [employer]MM[employer], gabapentin has been approved starting 11/17/19 to 12/16/20. Will fax determination to patient's pharmacy.

## 2019-12-20 NOTE — Telephone Encounter (Signed)
Pt called, pharmacy informed Pt, need PA for gabapentin (NEURONTIN) 300 MG capsule because of the quantity. Would like a call from the nurse.

## 2019-12-23 NOTE — Telephone Encounter (Signed)
I called pharmacy and pt did pick up on the 12-20-19 for gabapentin.

## 2020-01-06 ENCOUNTER — Telehealth: Payer: Self-pay | Admitting: Neurology

## 2020-01-06 MED ORDER — GABAPENTIN 300 MG PO CAPS: ORAL_CAPSULE | ORAL | 5 refills | Status: DC

## 2020-01-06 NOTE — Telephone Encounter (Signed)
Pt called, Dr. Hoyt Koch advised to get another epidural steroid injection before considering surgery. Would like a call from the nurse.

## 2020-01-06 NOTE — Telephone Encounter (Signed)
Had ESI 08/28/19, 11/14/19, few days ago her legs are burning, wake her up at night, not sleeping well, gabapentin was working really well for her, not sure if she over did it. Taking gabapentin 600 mg twice daily. Will add in extra gabapentin 300 mg in the evening or during the night if needed. If not helpful will send for another ESI.

## 2020-01-06 NOTE — Telephone Encounter (Signed)
I called pt and she is asking for [place] to order a epidural spinal injection (3rd) before surgery as option per Dr. Marikay Alar at [facility].  She had seen him in 11/2019.

## 2020-01-21 ENCOUNTER — Telehealth: Payer: Self-pay | Admitting: Neurology

## 2020-01-21 NOTE — Telephone Encounter (Signed)
I called and had to [facility] for pt.  I relayed that we do not do the ESI   here at the office, but I believe that she knows this.  She wants [place] to order one for her.  I will send to SS/NP re: order as per last note if gabapentin increase did not help.

## 2020-01-21 NOTE — Telephone Encounter (Signed)
I am wondering if the patient may be better served if we refer to the pain clinic portion of [place] [facility].

## 2020-01-21 NOTE — Telephone Encounter (Signed)
Pt states her legs and back is bothering her again and she'd like to come in for another steroid injection.  Please call

## 2020-01-21 NOTE — Telephone Encounter (Signed)
I called pt and let her know that per SS/NP to contact [facility] and see about getting that thru there office.  I did call and that some MD do this in the office.  She will call and see about getting this ordered and done at there office.  She verbalized understanding.

## 2020-01-21 NOTE — Telephone Encounter (Signed)
I spoke to pt and she had seen Dr. Hoyt Koch at [facility].  Would they do something like that there? If so pt is ok to go there.  Do we initiate or would pt?

## 2020-01-21 NOTE — Telephone Encounter (Signed)
Since already a patient there, have her check with Dr. Maisie Fus, maybe could assist in getting her in over there, but yes, my understanding can do ESI at the office.

## 2020-03-02 ENCOUNTER — Other Ambulatory Visit: Payer: Self-pay | Admitting: Family Medicine

## 2020-03-02 DIAGNOSIS — Z0000 Encounter for general adult medical examination without abnormal findings: Secondary | ICD-10-CM

## 2020-03-18 ENCOUNTER — Telehealth: Payer: Self-pay | Admitting: Neurology

## 2020-03-18 MED ORDER — GABAPENTIN 300 MG PO CAPS: ORAL_CAPSULE | ORAL | 4 refills | Status: AC

## 2020-03-18 NOTE — Telephone Encounter (Signed)
Pt called stating that her medication Gabapentin needs to be refilled and her pharmacy will not refill it until 03/22/20 because they need authorization because she is having to take some at night as well (Sarah's instructions). Best call back is [phone].

## 2020-03-18 NOTE — Telephone Encounter (Signed)
Spoke to Tonya Campbell.  She last received the gabapentin 02-15-20 #150.  She states that they are not wanting to refill until 03-22-20.  She took her last 2 caps this am.  I spoke to [place] at the pharmacy.  He stated they have filled for #37 days (this also in past months).  I asked why , could not really tell me.  He would call insurance to get override.  I spoke with SS/NP and we changed the prescription to 600mg  BID 300mg  at [facility].  Tonya Campbell informed.

## 2020-04-15 ENCOUNTER — Telehealth: Payer: Self-pay | Admitting: Neurology

## 2020-04-15 NOTE — Telephone Encounter (Signed)
..   Pt understands that although there may be some limitations with this type of visit, we will take all precautions to reduce any security or privacy concerns.  Pt understands that this will be treated like an in office visit and we will file with pt's insurance, and there may be a patient responsible charge related to this service. ? ?

## 2020-04-23 ENCOUNTER — Encounter: Payer: Self-pay | Admitting: Neurology

## 2020-04-23 ENCOUNTER — Telehealth (INDEPENDENT_AMBULATORY_CARE_PROVIDER_SITE_OTHER): Payer: 59 | Admitting: Neurology

## 2020-04-23 DIAGNOSIS — M5431 Sciatica, right side: Secondary | ICD-10-CM | POA: Diagnosis not present

## 2020-04-23 NOTE — Progress Notes (Signed)
I have read the note, and I agree with the clinical assessment and plan.  Charles K Willis   

## 2020-04-23 NOTE — Progress Notes (Signed)
Virtual Visit via Video Note  I connected with Tonya Campbell on 04/23/20 at 12:45 PM EST by a video enabled telemedicine application and verified that I am speaking with the correct person using two identifiers.  Location: Patient: at her home Provider: in the office    I discussed the limitations of evaluation and management by telemedicine and the availability of in person appointments. The patient expressed understanding and agreed to proceed.  History of Present Illness: 04/23/2020 SS: Tonya Campbell is a 45 year old female with history of significant anxiety, restless leg symptoms, back and right leg pain.  Nerve conduction was completely normal on the right leg.  MRI of the lumbar spine was relatively unremarkable.  She completed physical therapy.  She has seen neurosurgery Dr. Maisie Fus.  She has had 3 ESI's, the last was in December with Dr. Murray Hodgkins. ESI only lasts about 1 month. She mostly complains of low back pain radiating down the right leg.  Is on gabapentin 600 mg twice daily, 300 mg at bedtime.  This dose seems to control the pain fairly well.  She saw Dr. Maisie Fus last week, they have decided surgery is the next option.  She is planning to have it done in the summer months.  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  She is overall doing well.  Presents today for evaluation via virtual visit.  10/22/2019 SS: Tonya Campbell is a 45 year old female with history of significant anxiety, restless leg symptoms, back and leg pain. She was sent for x-ray of the lumbar spine, no prior fractions, some degenerative changes at the S1-S2 level. Nerve conduction evaluation was completely normal of the right leg. MRI of the lumbar spine was relatively unremarkable.She completed physical therapy. She received lumbar ESI on August 28, 2019.  She is taking gabapentin 300 mg twice a day. ESI resulted in 50% improvement in pain. 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  Pain level is 3/10.  Continues to do her exercises and stretching.  Mostly complains of low back pain, radiates down the right leg, can go down the left leg.  Is taking Robaxin at night, for muscle spasms.  She is seeing a [facility].  No changes to the bowels or bladder.  Overall, symptoms are stable, improved following ESI.  Presents today for follow-up unaccompanied.   Observations/Objective: Via virtual visit, is alert and oriented, speech is clear and concise, facial symmetry noted Moves all extremities without difficulty Gait appears steady and intact, tandem gait is normal.  Assessment and Plan:  1.  Low back pain, sciatica down the right leg -Continue gabapentin 600 mg twice daily, 300 mg at bedtime, refills sent in December  -Is planning for surgery with Dr. Maisie Fus in the summer months -Has had 3 ESI's, last was in December with Dr. Murray Hodgkins -Is currently doing well, has a plan in place, will continue gabapentin, refills are current, we talked about in the future, her PCP or neurosurgery can feel the gabapentin -Can call after surgery, can see when she recovers, although, not clear she needs to continue follow-up with our office   IMPRESSION: This MRI of the lumbar spine without contrast shows the following: 1.   At L4-L5, there are degenerative changes causing moderate left foraminal narrowing but no nerve root compression or spinal stenosis. 2.   At L5-S1, there is a small  right paramedian disc herniation causing mild to moderate right foraminal narrowing and moderately severe right lateral recess stenosis.  The disc contacts the right S1 nerve root without causing definite compression. 3.   There are milder degenerative changes at L2-L3 and L3-L4 that do not lead to significant foraminal or lateral recess narrowing, nerve root compression or spinal stenosis. Follow Up Instructions: Leave next follow-up  appointment open, depends on surgery, recovery, if we continue to prescribe gabapentin   I discussed the assessment and treatment plan with the patient. The patient was provided an opportunity to ask questions and all were answered. The patient agreed with the plan and demonstrated an understanding of the instructions.   The patient was advised to call back or seek an in-person evaluation if the symptoms worsen or if the condition fails to improve as anticipated.  I spent 20 minutes of face-to-face and non-face-to-face time with patient.  This included previsit chart review, lab review, study review, order entry, electronic health record documentation, patient education.  Otila Kluver, DNP  [facility] [address], [address] [place], [place] [address] [phone]

## 2020-04-24 ENCOUNTER — Ambulatory Visit: Payer: 59

## 2020-04-29 ENCOUNTER — Ambulatory Visit: Payer: 59

## 2020-06-15 ENCOUNTER — Inpatient Hospital Stay: Admission: RE | Admit: 2020-06-15 | Payer: 59 | Source: Ambulatory Visit

## 2020-06-24 IMAGING — MG DIGITAL SCREENING BILAT W/ TOMO W/ CAD
8 series · 8 of 24 positions shown · non-contrast
Comparison: None.

CLINICAL DATA: Screening.

EXAM:
DIGITAL SCREENING BILATERAL MAMMOGRAM WITH TOMO AND CAD

[R MLO synth-2D]
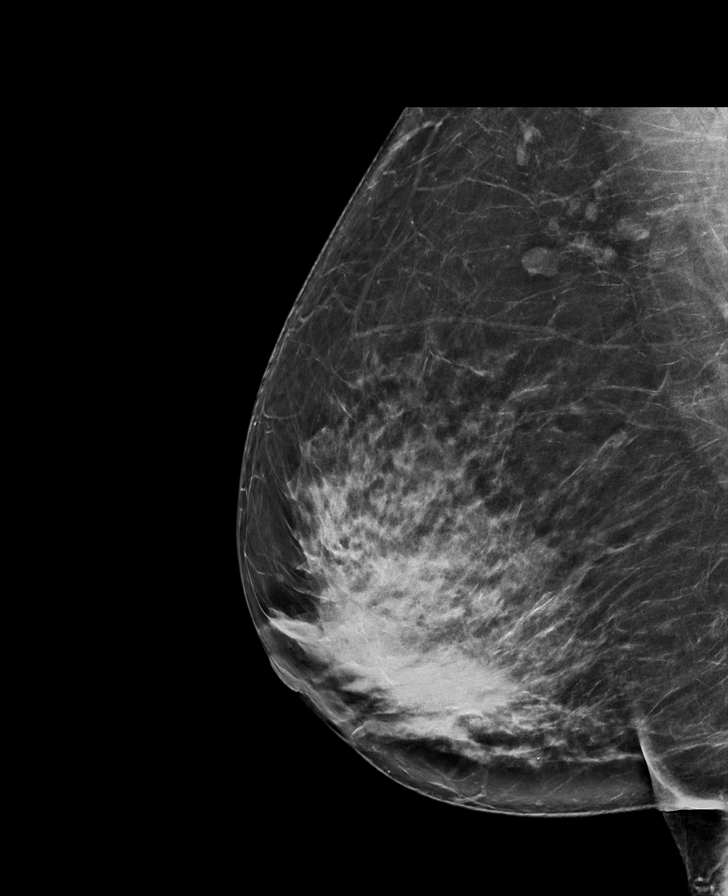

[L MLO synth-2D]
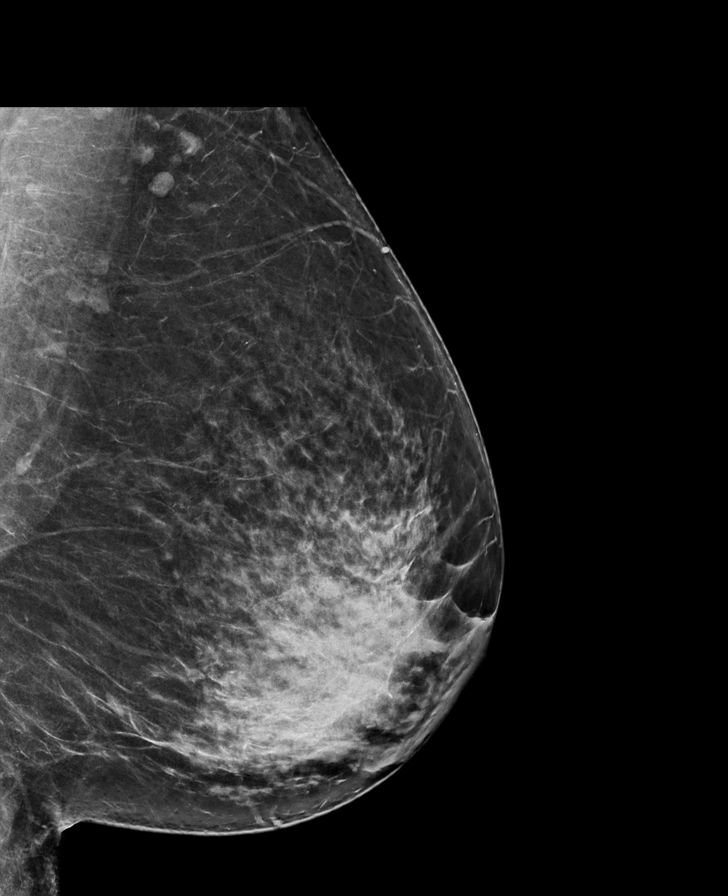

[R CC synth-2D]
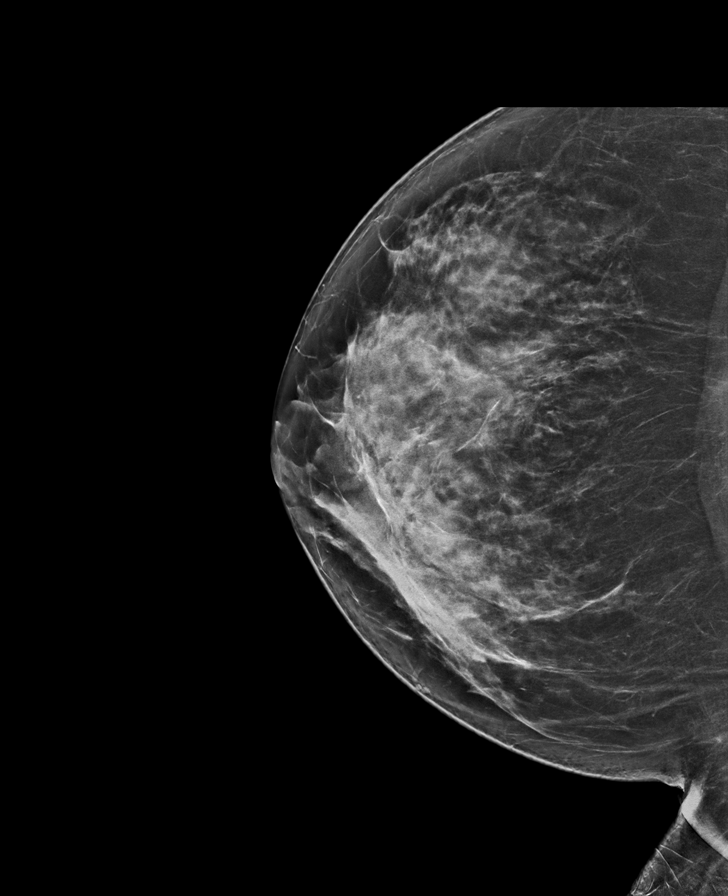

[L CC synth-2D]
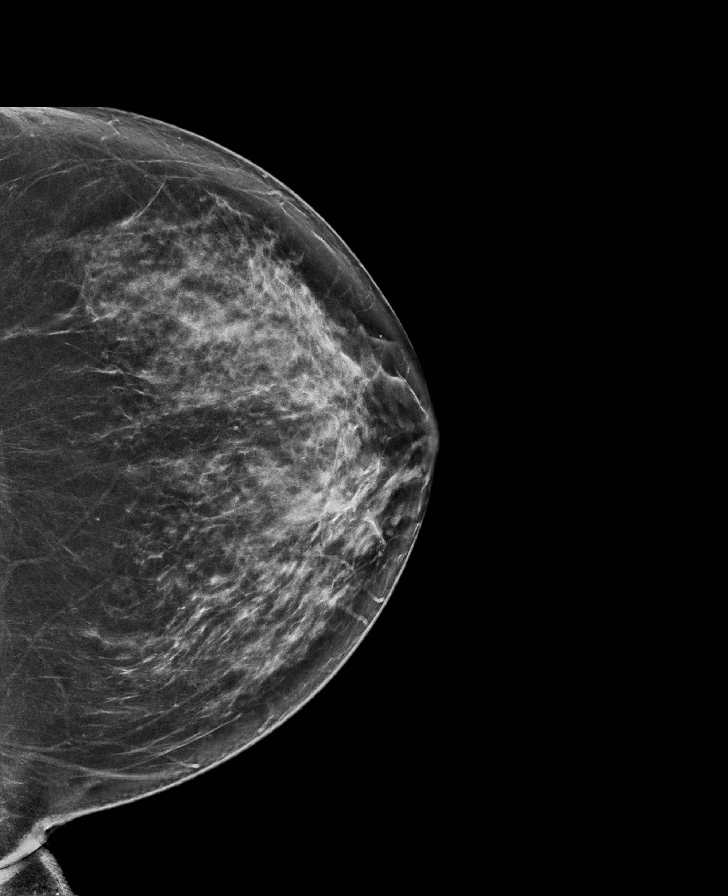

[R CC tomo · tomo slice 42/83.0]
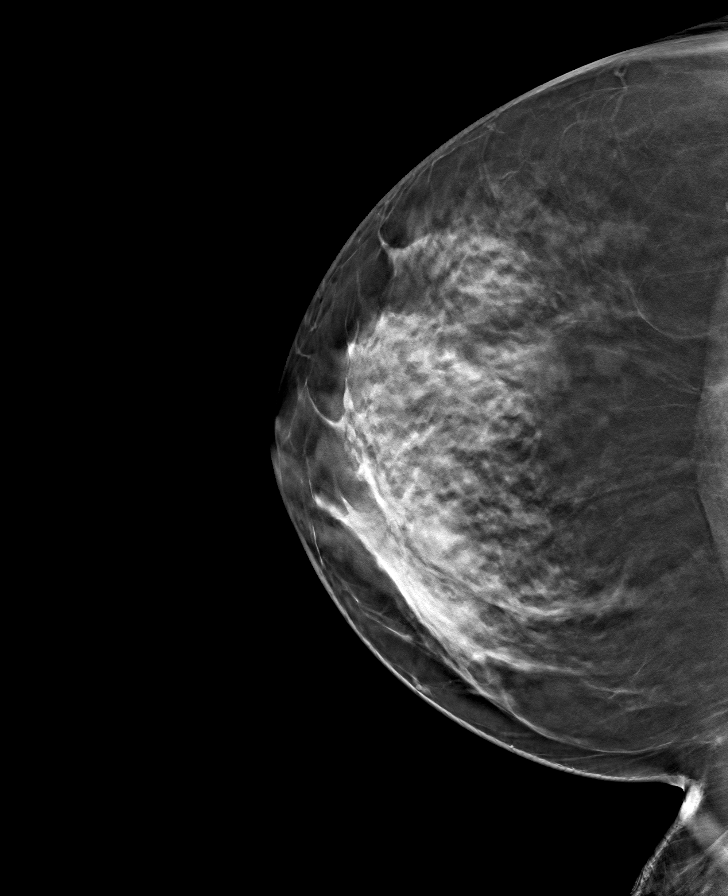

[L CC tomo · tomo slice 41/81.0]
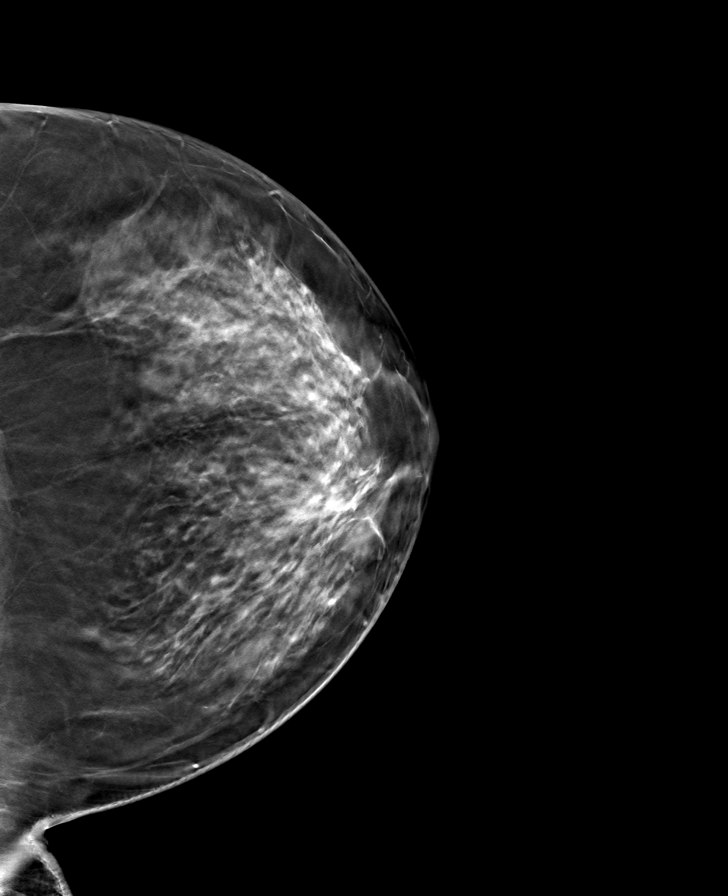

[L MLO tomo · tomo slice 43/84.0]
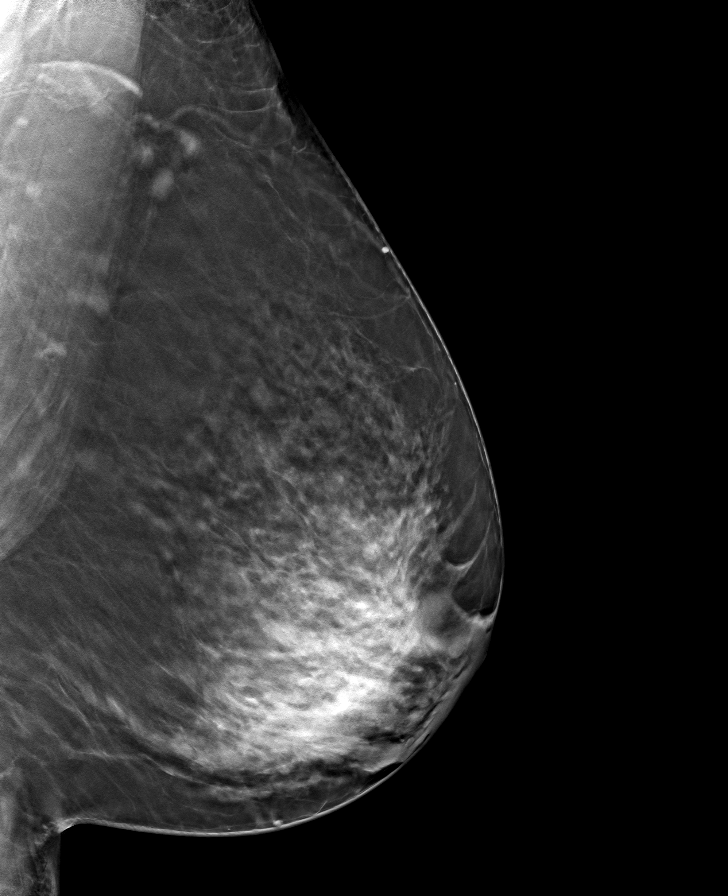

[R MLO tomo · tomo slice 42/83.0]
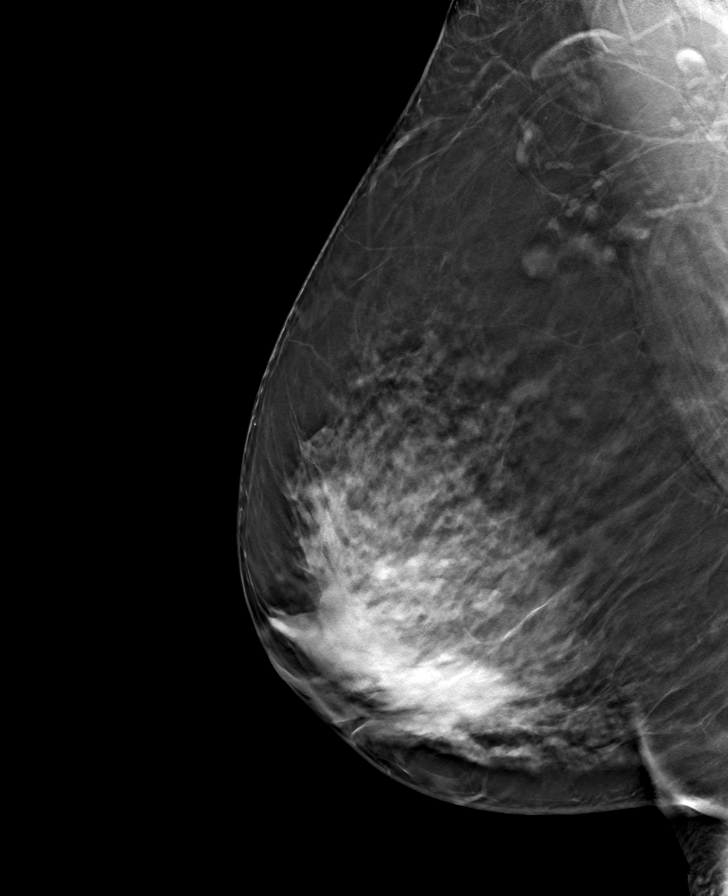

[8 of 24 positions shown; findings below may reference images not displayed]

ACR Breast Density Category c: The breast tissue is heterogeneously
dense, which may obscure small masses
FINDINGS: There are no findings suspicious for malignancy. Images were
processed with CAD.
IMPRESSION: No mammographic evidence of malignancy. A result letter of this
screening mammogram will be mailed directly to the patient.

RECOMMENDATION:
Screening mammogram in one year. (Code:EM-2-IHY)

BI-RADS CATEGORY  1: Negative.

## 2020-08-04 ENCOUNTER — Ambulatory Visit
Admission: RE | Admit: 2020-08-04 | Discharge: 2020-08-04 | Disposition: A | Discharge status: Home / Self Care | Payer: 59 | Source: Ambulatory Visit | Attending: Family Medicine | Admitting: Family Medicine

## 2020-08-04 ENCOUNTER — Other Ambulatory Visit: Payer: Self-pay

## 2020-08-04 DIAGNOSIS — Z0000 Encounter for general adult medical examination without abnormal findings: Secondary | ICD-10-CM

## 2020-11-04 ENCOUNTER — Other Ambulatory Visit (HOSPITAL_COMMUNITY)
Admission: RE | Admit: 2020-11-04 | Discharge: 2020-11-04 | Disposition: A | Discharge status: Home / Self Care | Payer: 59 | Source: Ambulatory Visit | Attending: Family Medicine | Admitting: Family Medicine

## 2020-11-04 ENCOUNTER — Other Ambulatory Visit: Payer: Self-pay | Admitting: Family Medicine

## 2020-11-04 DIAGNOSIS — Z124 Encounter for screening for malignant neoplasm of cervix: Secondary | ICD-10-CM | POA: Diagnosis present

## 2020-11-06 LAB — CYTOLOGY - PAP
Adequacy: ABSENT
Comment: NEGATIVE
Diagnosis: NEGATIVE
High risk HPV: NEGATIVE

## 2020-12-03 ENCOUNTER — Telehealth: Payer: Self-pay | Admitting: *Deleted

## 2020-12-03 NOTE — Telephone Encounter (Signed)
PA for gabapentin 300mg  started on covermymeds (key: ). Pt has pharmacy coverage through [employer] [phone]). Case (JO#[id number] approved through 12/03/2021.

## 2021-06-21 ENCOUNTER — Other Ambulatory Visit: Payer: Self-pay | Admitting: Family Medicine

## 2021-06-21 DIAGNOSIS — Z1231 Encounter for screening mammogram for malignant neoplasm of breast: Secondary | ICD-10-CM

## 2021-08-05 ENCOUNTER — Ambulatory Visit
Admission: RE | Admit: 2021-08-05 | Discharge: 2021-08-05 | Disposition: A | Discharge status: Home / Self Care | Payer: No Typology Code available for payment source | Source: Ambulatory Visit | Attending: Family Medicine | Admitting: Family Medicine

## 2021-08-05 DIAGNOSIS — Z1231 Encounter for screening mammogram for malignant neoplasm of breast: Secondary | ICD-10-CM

## 2022-01-19 DIAGNOSIS — M5416 Radiculopathy, lumbar region: Secondary | ICD-10-CM | POA: Diagnosis not present

## 2022-04-01 DIAGNOSIS — G8929 Other chronic pain: Secondary | ICD-10-CM | POA: Diagnosis not present

## 2022-04-01 DIAGNOSIS — F411 Generalized anxiety disorder: Secondary | ICD-10-CM | POA: Diagnosis not present

## 2022-04-01 DIAGNOSIS — Z13220 Encounter for screening for lipoid disorders: Secondary | ICD-10-CM | POA: Diagnosis not present

## 2022-04-01 DIAGNOSIS — Z124 Encounter for screening for malignant neoplasm of cervix: Secondary | ICD-10-CM | POA: Diagnosis not present

## 2022-04-01 DIAGNOSIS — Z0000 Encounter for general adult medical examination without abnormal findings: Secondary | ICD-10-CM | POA: Diagnosis not present

## 2022-04-01 DIAGNOSIS — M5136 Other intervertebral disc degeneration, lumbar region: Secondary | ICD-10-CM | POA: Diagnosis not present

## 2022-05-04 DIAGNOSIS — R42 Dizziness and giddiness: Secondary | ICD-10-CM | POA: Diagnosis not present

## 2022-05-09 ENCOUNTER — Other Ambulatory Visit: Payer: Self-pay | Admitting: Family Medicine

## 2022-05-09 ENCOUNTER — Encounter: Payer: Self-pay | Admitting: Family Medicine

## 2022-05-09 DIAGNOSIS — R519 Headache, unspecified: Secondary | ICD-10-CM

## 2022-05-20 DIAGNOSIS — R519 Headache, unspecified: Secondary | ICD-10-CM | POA: Diagnosis not present

## 2022-05-20 DIAGNOSIS — R42 Dizziness and giddiness: Secondary | ICD-10-CM | POA: Diagnosis not present

## 2022-06-23 ENCOUNTER — Other Ambulatory Visit: Payer: Self-pay | Admitting: Family Medicine

## 2022-06-23 DIAGNOSIS — Z1231 Encounter for screening mammogram for malignant neoplasm of breast: Secondary | ICD-10-CM

## 2022-06-24 DIAGNOSIS — M5416 Radiculopathy, lumbar region: Secondary | ICD-10-CM | POA: Diagnosis not present

## 2022-07-04 DIAGNOSIS — Z6835 Body mass index (BMI) 35.0-35.9, adult: Secondary | ICD-10-CM | POA: Diagnosis not present

## 2022-07-04 DIAGNOSIS — M5416 Radiculopathy, lumbar region: Secondary | ICD-10-CM | POA: Diagnosis not present

## 2022-07-04 DIAGNOSIS — M25552 Pain in left hip: Secondary | ICD-10-CM | POA: Diagnosis not present

## 2022-07-06 DIAGNOSIS — M5416 Radiculopathy, lumbar region: Secondary | ICD-10-CM | POA: Diagnosis not present

## 2022-07-06 DIAGNOSIS — M5127 Other intervertebral disc displacement, lumbosacral region: Secondary | ICD-10-CM | POA: Diagnosis not present

## 2022-07-08 DIAGNOSIS — M25552 Pain in left hip: Secondary | ICD-10-CM | POA: Diagnosis not present

## 2022-07-08 DIAGNOSIS — Z6836 Body mass index (BMI) 36.0-36.9, adult: Secondary | ICD-10-CM | POA: Diagnosis not present

## 2022-07-08 DIAGNOSIS — M5416 Radiculopathy, lumbar region: Secondary | ICD-10-CM | POA: Diagnosis not present

## 2022-07-11 DIAGNOSIS — M5416 Radiculopathy, lumbar region: Secondary | ICD-10-CM | POA: Diagnosis not present

## 2022-07-18 DIAGNOSIS — M5416 Radiculopathy, lumbar region: Secondary | ICD-10-CM | POA: Diagnosis not present

## 2022-08-03 DIAGNOSIS — M5416 Radiculopathy, lumbar region: Secondary | ICD-10-CM | POA: Diagnosis not present

## 2022-08-09 ENCOUNTER — Ambulatory Visit
Admission: RE | Admit: 2022-08-09 | Discharge: 2022-08-09 | Disposition: A | Discharge status: Home / Self Care | Payer: BC Managed Care – PPO | Source: Ambulatory Visit | Attending: Family Medicine | Admitting: Family Medicine

## 2022-08-09 DIAGNOSIS — Z1231 Encounter for screening mammogram for malignant neoplasm of breast: Secondary | ICD-10-CM

## 2022-08-10 DIAGNOSIS — M5416 Radiculopathy, lumbar region: Secondary | ICD-10-CM | POA: Diagnosis not present

## 2022-08-30 DIAGNOSIS — M25552 Pain in left hip: Secondary | ICD-10-CM | POA: Diagnosis not present

## 2022-10-27 DIAGNOSIS — M25852 Other specified joint disorders, left hip: Secondary | ICD-10-CM | POA: Diagnosis not present

## 2022-11-29 DIAGNOSIS — M5136 Other intervertebral disc degeneration, lumbar region: Secondary | ICD-10-CM | POA: Diagnosis not present

## 2022-11-29 DIAGNOSIS — F411 Generalized anxiety disorder: Secondary | ICD-10-CM | POA: Diagnosis not present

## 2022-11-29 DIAGNOSIS — G8929 Other chronic pain: Secondary | ICD-10-CM | POA: Diagnosis not present

## 2022-11-29 DIAGNOSIS — Z23 Encounter for immunization: Secondary | ICD-10-CM | POA: Diagnosis not present

## 2022-12-01 DIAGNOSIS — M25552 Pain in left hip: Secondary | ICD-10-CM | POA: Diagnosis not present

## 2023-02-07 DIAGNOSIS — M1612 Unilateral primary osteoarthritis, left hip: Secondary | ICD-10-CM | POA: Diagnosis not present

## 2023-04-11 DIAGNOSIS — M25552 Pain in left hip: Secondary | ICD-10-CM | POA: Diagnosis not present

## 2023-06-28 ENCOUNTER — Other Ambulatory Visit: Payer: Self-pay | Admitting: Family Medicine

## 2023-06-28 DIAGNOSIS — Z1231 Encounter for screening mammogram for malignant neoplasm of breast: Secondary | ICD-10-CM

## 2023-06-30 DIAGNOSIS — F419 Anxiety disorder, unspecified: Secondary | ICD-10-CM | POA: Diagnosis not present

## 2023-06-30 DIAGNOSIS — R202 Paresthesia of skin: Secondary | ICD-10-CM | POA: Diagnosis not present

## 2023-06-30 DIAGNOSIS — G8929 Other chronic pain: Secondary | ICD-10-CM | POA: Diagnosis not present

## 2023-06-30 DIAGNOSIS — M5136 Other intervertebral disc degeneration, lumbar region with discogenic back pain only: Secondary | ICD-10-CM | POA: Diagnosis not present

## 2023-08-23 ENCOUNTER — Ambulatory Visit
Admission: RE | Admit: 2023-08-23 | Discharge: 2023-08-23 | Disposition: A | Discharge status: Home / Self Care | Source: Ambulatory Visit | Attending: Family Medicine | Admitting: Family Medicine

## 2023-08-23 ENCOUNTER — Ambulatory Visit

## 2023-08-23 DIAGNOSIS — Z1231 Encounter for screening mammogram for malignant neoplasm of breast: Secondary | ICD-10-CM

## 2024-02-06 DIAGNOSIS — Z0000 Encounter for general adult medical examination without abnormal findings: Secondary | ICD-10-CM | POA: Diagnosis not present

## 2024-02-06 DIAGNOSIS — Z13220 Encounter for screening for lipoid disorders: Secondary | ICD-10-CM | POA: Diagnosis not present

## 2024-02-06 DIAGNOSIS — Z01818 Encounter for other preprocedural examination: Secondary | ICD-10-CM | POA: Diagnosis not present

## 2024-02-06 DIAGNOSIS — G8929 Other chronic pain: Secondary | ICD-10-CM | POA: Diagnosis not present

## 2024-02-06 DIAGNOSIS — F419 Anxiety disorder, unspecified: Secondary | ICD-10-CM | POA: Diagnosis not present

## 2024-02-06 DIAGNOSIS — M5136 Other intervertebral disc degeneration, lumbar region with discogenic back pain only: Secondary | ICD-10-CM | POA: Diagnosis not present

## 2024-02-08 ENCOUNTER — Telehealth (HOSPITAL_BASED_OUTPATIENT_CLINIC_OR_DEPARTMENT_OTHER): Payer: Self-pay | Admitting: *Deleted

## 2024-02-08 NOTE — Telephone Encounter (Signed)
 PAC team sent me a staff message, see message about referral.   I sent back to the [facility] Team pt will need to be reviewed if MD new pt appt or if she can be on HF1st schedule.   We also do not have a clearance request from the surgeon office. Looks like PCP is referring pt due to abnormal EKG RBB.   Referral states pt is needing preop clearance for hip replacement surgery. The surgeon office will need to fax ASAP to our fax# [phone] attn: preop team. We can schedule the pt for the abnormal EKG at this time, however if needs surgery as well better to have all information ready for the cardiologist.    I will send this message to PCP to see if they may be able to shed some light on who is doing the surgery.

## 2024-02-08 NOTE — Telephone Encounter (Signed)
-----   Message from [place] F sent at 02/08/2024 10:00 AM EST ----- Regarding: Pre-op Referral Good morning,  We received a referral for this patient to be seen for pre-op clearance. [facility] sent the referral so I don't have information about the procedure and who will be performing the procedure. Margarete is requesting for us  to schedule to patient within 2-4 weeks. Could this potentially be added to the [facility]?  Thanks, Rosina Server

## 2024-02-12 DIAGNOSIS — Z01818 Encounter for other preprocedural examination: Secondary | ICD-10-CM | POA: Diagnosis not present

## 2024-02-12 NOTE — Telephone Encounter (Signed)
 Patient is returning call.

## 2024-02-12 NOTE — Telephone Encounter (Signed)
 Pt has been scheduled new pt appt Dr. Ren Ny. Pt referred by PCP to cardiology abnormal EKG RBBB. Pt also needs preop clearance. Pt tells me today surgeon is Dr. Fidel. She stated that she called and left a message for surgery scheduler Kirke to fax over a clearance request ASAP to our office [phone].   I assured the pt that I will reach out to surgery scheduler as well.

## 2024-02-12 NOTE — Telephone Encounter (Signed)
 Left message for the pt that we received a referral from PCP for abnormal EKG, however states about hip surgery as well.    We will need the surgeon office to fax over a clearance request today to [phone] attn; Preop team. We will need this information as well be for scheduling the new pt appt.

## 2024-02-13 ENCOUNTER — Telehealth (HOSPITAL_BASED_OUTPATIENT_CLINIC_OR_DEPARTMENT_OTHER): Payer: Self-pay

## 2024-02-13 NOTE — Telephone Encounter (Signed)
   Pre-operative Risk Assessment    Patient Name: Tonya Campbell  DOB: 09-Oct-1975 MRN: [id number]   Date of last office visit: NA Date of next office visit: 02/20/24 with [place] (NEW PT APPOINTMENT)  Request for Surgical Clearance    Procedure:  Left Total Hip Arthroplasty  Date of Surgery:  Clearance TBD                                 Surgeon:  Dr. Fidel Surgeon's Group or Practice Name:  [facility]  Phone number:  [phone] - Joen Sic Fax number:  [phone]   Type of Clearance Requested:   - Medical    Type of Anesthesia:  Spinal   Additional requests/questions:    Bonney Augustin JONETTA Delores   02/13/2024, 11:53 AM

## 2024-02-13 NOTE — Telephone Encounter (Signed)
   Name: ELZENA MUSTON  DOB: November 29, 1975  MRN: [id number]  Primary Cardiologist: None  Chart reviewed as part of pre-operative protocol coverage. The patient has an upcoming visit scheduled with Dr. Ren on 02/20/2024 at which time clearance can be addressed in case there are any issues that would impact surgical recommendations.  Left total hip arthroplasty is not scheduled until TBD as below. I added preop FYI to appointment note so that provider is aware to address at time of outpatient visit.  Per office protocol the cardiology provider should forward their finalized clearance decision and recommendations regarding antiplatelet therapy to the requesting party below.    I will route this message as FYI to requesting party and remove this message from the preop box as separate preop APP input not needed at this time.   Please call with any questions.  Damien JAYSON Braver, NP  02/13/2024, 12:38 PM

## 2024-02-20 ENCOUNTER — Ambulatory Visit

## 2024-02-20 VITALS — BP 124/80 | HR 76 | Resp 12 | Ht 60.0 in | Wt 182.0 lb

## 2024-02-20 DIAGNOSIS — I25119 Atherosclerotic heart disease of native coronary artery with unspecified angina pectoris: Secondary | ICD-10-CM | POA: Diagnosis not present

## 2024-02-20 DIAGNOSIS — Z01818 Encounter for other preprocedural examination: Secondary | ICD-10-CM | POA: Diagnosis not present

## 2024-02-20 NOTE — Progress Notes (Addendum)
 "     Cardiology Office Note Date:  02/20/2024  ID:  Tonya Campbell, DOB 1975-07-09, MRN [id number] PCP:  Alben Therisa MATSU, PA  Cardiologist:  Joelle VEAR Ren Donley, MD  Chief Complaint  Patient presents with   Pre-op Exam    History of Present Illness: Tonya Campbell is a 48 y.o. female who presents for pre-op eval for TAH.  She is presenting for preop evaluation for hip surgery.  She denies chest pain or dyspnea with exertion.  She has not been very active due to limited mobility given hip pain.  She does walk intermittently with a cane and denies symptoms with that.  She also do some stationary exercises and seems to be tolerating those.  She denies any history of hypertension but reports that both her mother and sister have high blood pressure.  She denies any tobacco use or family history of ASCVD.  ROS: Please see the history of present illness. All other systems are reviewed and negative.   Past Medical History:  Diagnosis Date   Anxiety    Back pain    Dizziness    RLS (restless legs syndrome)     Past Surgical History:  Procedure Laterality Date   APPENDECTOMY     CARPAL TUNNEL RELEASE Right     Current Outpatient Medications  Medication Sig Dispense Refill   ALPRAZolam (XANAX) 0.25 MG tablet      gabapentin  (NEURONTIN ) 300 MG capsule Take 2 caps by mouth twice daily and 1 cap at bedtime. 150 capsule 4   meloxicam (MOBIC) 15 MG tablet      methocarbamol  (ROBAXIN ) 500 MG tablet Take 2 tablets (1,000 mg total) by mouth 4 (four) times daily as needed (Pain). 20 tablet 0   Multiple Vitamin (MULTIVITAMIN WITH MINERALS) TABS tablet Take 1 tablet by mouth daily.     OVER THE COUNTER MEDICATION Place 1 patch onto the skin daily as needed (For back pain.). Well Patch Backache Pain Relief Patch     sertraline (ZOLOFT) 100 MG tablet      VITAMIN D PO Take 2,000 Units by mouth.     No current facility-administered medications for this visit.    Allergies:   Patient has no  known allergies.   Social History:  see above  Family History:  see above  PHYSICAL EXAM: VS:  BP 124/80 (BP Location: Left Arm, Patient Position: Sitting, Cuff Size: Large)   Pulse 76   Resp 12   Ht 5' (1.524 m)   Wt 182 lb (82.6 kg)   SpO2 98%   BMI 35.54 kg/m  , BMI Body mass index is 35.54 kg/m. GEN: Well nourished, well developed, in no acute distress HEENT: normal Neck: no JVD, carotid bruits, or masses Cardiac: RRR; no murmurs, rubs, or gallops,no edema  Respiratory:  CTAB bilaterally, normal work of breathing GI: soft, nontender, nondistended, + BS Extremities: No LE edema Skin: warm and dry, no rash Neuro:  Strength and sensation are intact  EKG: LVH, RBBB  Recent Labs: Reviewed  Studies: Reviewed  ASSESSMENT AND PLAN: Tonya Campbell is a 48 y.o. female who presents for new visit.  - Regarding preop evaluation, she has been asymptomatic though with limited activity.  Given overall low risk profile, we will obtain 2D echocardiogram.  If echo is normal, can proceed with surgery. - EKG today with baseline bundle-branch block and new LVH compared to prior echo.  I am concerned that she may have masked hypertension.  Will order ambulatory BP monitoring and I encouraged the patient to check her blood pressure couple times a week. - Will also obtain an coronary artery calcium score to establish baseline cardiac profile.   Addendum 1/3 - TTE with normal biventricular function; no additional evaluation and patient can proceed to surgery.  Signed, Joelle VEAR Ren Donley, MD  02/20/2024 9:40 AM    [facility] "

## 2024-02-20 NOTE — Patient Instructions (Signed)
 Medication Instructions:  Your physician recommends that you continue on your current medications as directed. Please refer to the Current Medication list given to you today.  *If you need a refill on your cardiac medications before your next appointment, please call your pharmacy*  Lab Work: NONE If you have labs (blood work) drawn today and your tests are completely normal, you will receive your results only by: MyChart Message (if you have MyChart) OR A paper copy in the mail If you have any lab test that is abnormal or we need to change your treatment, we will call you to review the results.  Testing/Procedures: Coronary Calcium Score Your physician has requested that you have a coronary calcium score performed. This is not covered by insurance and will be an out-of-pocket cost of approximately $99.   Echocardiogram Your physician has requested that you have an echocardiogram. Echocardiography is a painless test that uses sound waves to create images of your heart. It provides your doctor with information about the size and shape of your heart and how well your heart's chambers and valves are working. This procedure takes approximately one hour. There are no restrictions for this procedure. Please do NOT wear cologne, perfume, aftershave, or lotions (deodorant is allowed). Please arrive 15 minutes prior to your appointment time.  Please note: We ask at that you not bring children with you during ultrasound (echo/ vascular) testing. Due to room size and safety concerns, children are not allowed in the ultrasound rooms during exams. Our front office staff cannot provide observation of children in our lobby area while testing is being conducted. An adult accompanying a patient to their appointment will only be allowed in the ultrasound room at the discretion of the ultrasound technician under special circumstances. We apologize for any inconvenience.  Ambulatory Blood Pressure  Monitoring  Follow-Up: At [facility], you and your health needs are our priority.  As part of our continuing mission to provide you with exceptional heart care, our providers are all part of one team.  This team includes your primary Cardiologist (physician) and Advanced Practice Providers or APPs (Physician Assistants and Nurse Practitioners) who all work together to provide you with the care you need, when you need it.  Your next appointment:   As needed  Provider:   Ren, MD  We recommend signing up for the patient portal called MyChart.  Sign up information is provided on this After Visit Summary.  MyChart is used to connect with patients for Virtual Visits (Telemedicine).  Patients are able to view lab/test results, encounter notes, upcoming appointments, etc.  Non-urgent messages can be sent to your provider as well.   To learn more about what you can do with MyChart, go to [email or web address].

## 2024-03-20 ENCOUNTER — Telehealth: Payer: Self-pay

## 2024-03-20 NOTE — Telephone Encounter (Signed)
.  STAT if patient feels like he/she is going to faint   Are you dizzy, lightheaded, or faint now?  No  Have you passed out?  No but she lost balance and had a fall this morning around 8:00 AM  Do you have any other symptoms?  Headache developing now   Have you checked your HR and BP (record if available)?  No

## 2024-03-20 NOTE — Telephone Encounter (Signed)
 Lvmtcb 03/20/24

## 2024-03-22 ENCOUNTER — Ambulatory Visit (HOSPITAL_COMMUNITY)
Admission: RE | Admit: 2024-03-22 | Discharge: 2024-03-22 | Disposition: A | Discharge status: Home / Self Care | Source: Ambulatory Visit

## 2024-03-22 ENCOUNTER — Ambulatory Visit (HOSPITAL_COMMUNITY)

## 2024-03-22 DIAGNOSIS — Z01818 Encounter for other preprocedural examination: Secondary | ICD-10-CM | POA: Insufficient documentation

## 2024-03-22 DIAGNOSIS — Z01810 Encounter for preprocedural cardiovascular examination: Secondary | ICD-10-CM

## 2024-03-22 LAB — ECHOCARDIOGRAM COMPLETE: S' Lateral: 3.1 cm

## 2024-03-22 MED ORDER — PERFLUTREN LIPID MICROSPHERE
1.0000 mL | INTRAVENOUS | Status: AC | PRN
  Administered 2024-03-22: 3 mL via INTRAVENOUS

## 2024-03-22 NOTE — Telephone Encounter (Signed)
 Left voice message to call back 1/2

## 2024-03-22 NOTE — Telephone Encounter (Signed)
"  Patient returned call.   "

## 2024-03-22 NOTE — Telephone Encounter (Signed)
"  Left message to call back.   "

## 2024-03-23 ENCOUNTER — Ambulatory Visit: Payer: Self-pay

## 2024-03-25 NOTE — Telephone Encounter (Signed)
 Spoke with Pt. Pt states she had dizziness when bending over to leash her dog. Pt states she has had problems with vertigo in the past. Pt inquired about a monitor that she thought had been ordered for her after the last office visit. Did not see a monitor pending. Pt states that she does not have a record of her BP but knows that the highest was 149/100. Pt takes her BP randomly. Pt does not have any current symptoms. Pt advised to take BP at the same time each day and keep a record, make slow transitions when standing/sitting/bending, and that we would check on the monitor and any other recommendations. Precautions given for syncope. Pt stated understanding.

## 2024-03-25 NOTE — Telephone Encounter (Signed)
 Patient was returning call. Please advise ?

## 2024-03-25 NOTE — Telephone Encounter (Signed)
 Left message for pt to call.

## 2024-03-28 NOTE — Telephone Encounter (Signed)
 Spoke with Pt. Gave recommendations. Pt stated understanding.

## 2024-04-05 ENCOUNTER — Ambulatory Visit (HOSPITAL_COMMUNITY)
Admission: RE | Admit: 2024-04-05 | Discharge: 2024-04-05 | Disposition: A | Discharge status: Home / Self Care | Payer: Self-pay | Source: Ambulatory Visit

## 2024-04-05 DIAGNOSIS — I25119 Atherosclerotic heart disease of native coronary artery with unspecified angina pectoris: Secondary | ICD-10-CM | POA: Insufficient documentation
